# Patient Record
Sex: Male | Born: 1985
Health system: Southern US, Community
[De-identification: ages and names within clinical notes are randomized; demographics above are authoritative.]

## PROBLEM LIST (undated history)

## (undated) DIAGNOSIS — R112 Nausea with vomiting, unspecified: Secondary | ICD-10-CM

## (undated) DIAGNOSIS — K219 Gastro-esophageal reflux disease without esophagitis: Secondary | ICD-10-CM

## (undated) DIAGNOSIS — Z9889 Other specified postprocedural states: Secondary | ICD-10-CM

## (undated) HISTORY — PX: WISDOM TOOTH EXTRACTION: SHX21

---

## 2005-02-25 ENCOUNTER — Emergency Department (HOSPITAL_COMMUNITY): Admission: EM | Admit: 2005-02-25 | Discharge: 2005-02-25 | Payer: Self-pay | Admitting: Emergency Medicine

## 2006-02-20 ENCOUNTER — Emergency Department (HOSPITAL_COMMUNITY): Admission: EM | Admit: 2006-02-20 | Discharge: 2006-02-20 | Payer: Self-pay | Admitting: Emergency Medicine

## 2017-06-22 ENCOUNTER — Emergency Department (HOSPITAL_COMMUNITY)
Admission: EM | Admit: 2017-06-22 | Discharge: 2017-06-23 | Disposition: A | Discharge status: Home / Self Care | Payer: BLUE CROSS/BLUE SHIELD | Attending: Emergency Medicine | Admitting: Emergency Medicine

## 2017-06-22 ENCOUNTER — Emergency Department (HOSPITAL_COMMUNITY): Payer: BLUE CROSS/BLUE SHIELD

## 2017-06-22 ENCOUNTER — Other Ambulatory Visit: Payer: Self-pay

## 2017-06-22 DIAGNOSIS — Y92310 Basketball court as the place of occurrence of the external cause: Secondary | ICD-10-CM | POA: Insufficient documentation

## 2017-06-22 DIAGNOSIS — W19XXXA Unspecified fall, initial encounter: Secondary | ICD-10-CM | POA: Diagnosis not present

## 2017-06-22 DIAGNOSIS — Y9367 Activity, basketball: Secondary | ICD-10-CM | POA: Insufficient documentation

## 2017-06-22 DIAGNOSIS — Y998 Other external cause status: Secondary | ICD-10-CM | POA: Insufficient documentation

## 2017-06-22 DIAGNOSIS — S6992XA Unspecified injury of left wrist, hand and finger(s), initial encounter: Secondary | ICD-10-CM | POA: Diagnosis present

## 2017-06-22 DIAGNOSIS — Z8781 Personal history of (healed) traumatic fracture: Secondary | ICD-10-CM

## 2017-06-22 DIAGNOSIS — S52502A Unspecified fracture of the lower end of left radius, initial encounter for closed fracture: Secondary | ICD-10-CM | POA: Diagnosis not present

## 2017-06-22 MED ORDER — OXYCODONE-ACETAMINOPHEN 5-325 MG PO TABS: 1.0000 | ORAL_TABLET | ORAL | 0 refills | Status: DC | PRN

## 2017-06-22 MED ORDER — ONDANSETRON HCL 4 MG/2ML IJ SOLN
4.0000 mg | Freq: Once | INTRAMUSCULAR | Status: AC
  Administered 2017-06-22: 4 mg via INTRAVENOUS
  Filled 2017-06-22: qty 2

## 2017-06-22 MED ORDER — METOCLOPRAMIDE HCL 5 MG/ML IJ SOLN
10.0000 mg | Freq: Once | INTRAMUSCULAR | Status: AC
  Administered 2017-06-22: 10 mg via INTRAVENOUS
  Filled 2017-06-22: qty 2

## 2017-06-22 MED ORDER — ONDANSETRON 4 MG PO TBDP: 4.0000 mg | ORAL_TABLET | Freq: Three times a day (TID) | ORAL | 0 refills | Status: DC | PRN

## 2017-06-22 MED ORDER — FENTANYL CITRATE (PF) 100 MCG/2ML IJ SOLN
50.0000 ug | Freq: Once | INTRAMUSCULAR | Status: AC
  Administered 2017-06-22: 50 ug via INTRAMUSCULAR
  Filled 2017-06-22: qty 2

## 2017-06-22 MED ORDER — OXYCODONE-ACETAMINOPHEN 5-325 MG PO TABS: 1.0000 | ORAL_TABLET | Freq: Once | ORAL | Status: DC

## 2017-06-22 MED ORDER — KETAMINE HCL-SODIUM CHLORIDE 100-0.9 MG/10ML-% IV SOSY
1.0000 mg/kg | PREFILLED_SYRINGE | Freq: Once | INTRAVENOUS | Status: AC
  Administered 2017-06-22: 82 mg via INTRAVENOUS
  Filled 2017-06-22: qty 10

## 2017-06-22 NOTE — ED Notes (Signed)
Pt given food and drink.

## 2017-06-22 NOTE — ED Provider Notes (Signed)
Reduction of fracture Date/Time: 06/22/2017 11:45 PM Performed by: Maia PlanLong, Joshua G, MD Authorized by: Maia PlanLong, Joshua G, MD  Consent: Written consent obtained. Risks and benefits: risks, benefits and alternatives were discussed Consent given by: patient Patient understanding: patient states understanding of the procedure being performed Patient consent: the patient's understanding of the procedure matches consent given Procedure consent: procedure consent matches procedure scheduled Relevant documents: relevant documents present and verified Test results: test results available and properly labeled Imaging studies: imaging studies available Required items: required blood products, implants, devices, and special equipment available Patient identity confirmed: verbally with patient, arm band and hospital-assigned identification number Time out: Immediately prior to procedure a "time out" was called to verify the correct patient, procedure, equipment, support staff and site/side marked as required. Preparation: Patient was prepped and draped in the usual sterile fashion. Local anesthesia used: no  Anesthesia: Local anesthesia used: no  Sedation: Patient sedated: yes Sedation type: moderate (conscious) sedation Sedatives: ketamine Sedation start date/time: 06/22/2017 9:45 PM Sedation end date/time: 06/22/2017 10:15 PM Vitals: Vital signs were monitored during sedation.  Patient tolerance: Patient tolerated the procedure well with no immediate complications Comments: Traction applied to the left hand with flexion of the wrist until the deformity was reduced. Portable x-ray on hand with improved alignment.   .Sedation Date/Time: 06/22/2017 11:50 PM Performed by: Maia PlanLong, Joshua G, MD Authorized by: Maia PlanLong, Joshua G, MD   Consent:    Consent obtained:  Written   Consent given by:  Patient   Risks discussed:  Allergic reaction, dysrhythmia, inadequate sedation, nausea, vomiting, respiratory  compromise necessitating ventilatory assistance and intubation, prolonged sedation necessitating reversal and prolonged hypoxia resulting in organ damage   Alternatives discussed:  Analgesia without sedation Universal protocol:    Procedure explained and questions answered to patient or proxy's satisfaction: yes     Relevant documents present and verified: yes     Test results available and properly labeled: yes     Imaging studies available: yes     Required blood products, implants, devices, and special equipment available: yes     Immediately prior to procedure a time out was called: yes     Patient identity confirmation method:  Arm band and hospital-assigned identification number Indications:    Procedure performed:  Fracture reduction   Procedure necessitating sedation performed by:  Physician performing sedation   Intended level of sedation:  Moderate (conscious sedation) Pre-sedation assessment:    Time since last food or drink:  3.5   ASA classification: class 1 - normal, healthy patient     Mallampati score:  I - soft palate, uvula, fauces, pillars visible   Pre-sedation assessments completed and reviewed: airway patency, cardiovascular function, hydration status, mental status, nausea/vomiting, pain level, respiratory function and temperature   Immediate pre-procedure details:    Reassessment: Patient reassessed immediately prior to procedure     Reviewed: vital signs, relevant labs/tests and NPO status     Verified: bag valve mask available, emergency equipment available, intubation equipment available, IV patency confirmed, oxygen available, reversal medications available and suction available   Procedure details (see MAR for exact dosages):    Preoxygenation:  Nasal cannula   Sedation:  Ketamine   Intra-procedure monitoring:  Blood pressure monitoring, cardiac monitor, continuous capnometry, continuous pulse oximetry, frequent LOC assessments and frequent vital sign checks    Intra-procedure events: none     Total Provider sedation time (minutes):  30 Post-procedure details:    Post-sedation assessment completed:  06/22/2017 11:52 PM  Attendance: Constant attendance by certified staff until patient recovered     Recovery: Patient returned to pre-procedure baseline     Post-sedation assessments completed and reviewed: airway patency, cardiovascular function, hydration status, mental status, nausea/vomiting, pain level, respiratory function and temperature     Patient is stable for discharge or admission: yes     Patient tolerance:  Tolerated well, no immediate complications .Splint Application Date/Time: 06/22/2017 11:52 PM Performed by: Maia Plan, MD Authorized by: Maia Plan, MD   Consent:    Consent obtained:  Verbal   Consent given by:  Patient   Risks discussed:  Discoloration, numbness, pain and swelling Pre-procedure details:    Sensation:  Normal   Skin color:  Normal  Procedure details:    Laterality:  Left   Location:  Wrist   Wrist:  L wrist   Splint type:  Sugar tong   Supplies:  Ortho-Glass Post-procedure details:    Pain:  Improved   Sensation:  Normal   Skin color:  Normal   Patient tolerance of procedure:  Tolerated well, no immediate complications    I assumed primary care of this patient from the PA in triage.  The patient had a FOOSH injury while playing basketball.  He has a displaced distal radius fracture of the nondominant hand.  The hand is neurovascularly intact both pre-and postreduction.  The PA discussed with Dr. Orlan Leavens who advised reduction in the emergency department and referral to his office in the morning.   Patient tolerated the sedation and procedure well.  Sugar tong splint applied.  Patient tolerating PO at time of discharge. Patient to follow with the hand surgeon in the AM.   At this time, I do not feel there is any life-threatening condition present. I have reviewed and discussed all results (EKG,  imaging, lab, urine as appropriate), exam findings with patient. I have reviewed nursing notes and appropriate previous records.  I feel the patient is safe to be discharged home without further emergent workup. Discussed usual and customary return precautions. Patient and family (if present) verbalize understanding and are comfortable with this plan.  Patient will follow-up with their primary care provider. If they do not have a primary care provider, information for follow-up has been provided to them. All questions have been answered.  Alona Bene, MD    Maia Plan, MD 06/22/17 [phone]

## 2017-06-22 NOTE — Discharge Instructions (Signed)
You were seen in the ED today with a wrist fracture. We reduced the fracture. You will need to see the hand surgeon in the morning. Call the number listed to confirm the appointment. Do not eat anything prior to the appointment.   Keep the splint clean and dry. Take pain medication as needed. Do not drive a car if taking this mediation.

## 2017-06-22 NOTE — Progress Notes (Signed)
Orthopedic Tech Progress Note Patient Details:  Carlos Pittman 1985-12-13 [id number]  Ortho Devices Type of Ortho Device: Sugartong splint Ortho Device/Splint Location: lue Ortho Device/Splint Interventions: Ordered, Application, Adjustment   Post Interventions Patient Tolerated: Well Instructions Provided: Care of device, Adjustment of device Applied splint post reduction. Pt already had an arm sling.  [name], Zyad Boomer J 06/22/2017, 10:18 PM

## 2017-06-22 NOTE — ED Triage Notes (Signed)
Per pt he was playing basket ball and fell on his left wrist. Obvious dislocation. Pt is alert oriented. Says pain is 8 out of 10.

## 2017-06-22 NOTE — ED Provider Notes (Signed)
[facility] EMERGENCY DEPARTMENT Provider Note   CSN: [id number] Arrival date & time: 06/22/17  1815     History   Chief Complaint Chief Complaint  Patient presents with  . Wrist Injury    HPI Broden Holt is a 32 y.o. male with acute onset of left wrist pain status post mechanical fall that occurred while playing basketball prior to arrival.  Patient states he fell on his outstretched left hand and immediately felt pain with obvious deformity.  Denies numbness or tingling to his fingers.  Denies elbow pain or shoulder pain.  No head trauma or LOC.  No other injuries noted.  Is not on anticoagulation.  Last meal was at 11 AM today.  The history is provided by the patient.    No past medical history on file.  There are no active problems to display for this patient.   The histories are not reviewed yet. Please review them in the "History" navigator section and refresh this SmartLink.     Home Medications    Prior to Admission medications   Not on File    Family History No family history on file.  Social History Social History   Tobacco Use  . Smoking status: Not on file  Substance Use Topics  . Alcohol use: Not on file  . Drug use: Not on file     Allergies   Patient has no allergy information on record.   Review of Systems Review of Systems  Musculoskeletal: Positive for arthralgias and joint swelling.  Skin: Negative for wound.  All other systems reviewed and are negative.    Physical Exam Updated Vital Signs BP (!) 179/108   Pulse (!) 144   Temp 99.2 F (37.3 C) (Oral)   Resp (!) 28   Ht 5\' 11"  (1.803 m)   Wt 81.6 kg (180 lb)   SpO2 98%   BMI 25.10 kg/m   Physical Exam  Constitutional: He appears well-developed and well-nourished. No distress.  HENT:  Head: Normocephalic and atraumatic.  Eyes: Conjunctivae are normal.  Cardiovascular: Normal rate and intact distal pulses.  Pulmonary/Chest: Effort normal.    Abdominal: Soft.  Musculoskeletal:  Left wrist with obvious deformity, appears to be dinner fork deformity. No wounds. Able to move all digits with normal sensation. Good capillary refill.  Elbow and digits without tenderness.   Neurological: He is alert.  Skin: Skin is warm.  Psychiatric: He has a normal mood and affect. His behavior is normal.  Nursing note and vitals reviewed.    ED Treatments / Results  Labs (all labs ordered are listed, but only abnormal results are displayed) Labs Reviewed - No data to display  EKG  EKG Interpretation None       Radiology Dg Forearm Left  Result Date: 06/22/2017 CLINICAL DATA:  Fall EXAM: LEFT FOREARM - 2 VIEW COMPARISON:  None. FINDINGS: There is a comminuted fracture involving the distal radius. The fracture extends into the radiocarpal joint. There is complete dorsal displacement of the distal fragment with respect to the remainder of the radius. A displaced ulnar styloid fracture is associated. IMPRESSION: Comminuted markedly displaced fracture of the distal radius. Ulnar styloid fracture. Electronically Signed   By: Jolaine Click M.D.   On: 06/22/2017 20:27   Dg Wrist Complete Left  Result Date: 06/22/2017 CLINICAL DATA:  Fall during basketball game with pain and deformity. EXAM: LEFT WRIST - COMPLETE 3+ VIEW COMPARISON:  None. FINDINGS: Transverse fracture of the distal radial metaphysis  with complete dorsal displacement of the distal fragment and foreshortening. Transverse fracture of the ulnar styloid, also dorsally displaced. Carpal bones move with the distal radial articular surface. IMPRESSION: Distal radial metaphyseal fracture and ulnar styloid fracture completely displaced dorsally with some foreshortening. Carpal bones move with the distal radial articular surface. Electronically Signed   By: Paulina FusiMark  Shogry M.D.   On: 06/22/2017 20:28    Procedures Procedures (including critical care time)  Medications Ordered in  ED Medications  fentaNYL (SUBLIMAZE) injection 50 mcg (50 mcg Intramuscular Given 06/22/17 1958)  ketamine 100 mg in normal saline 10 mL (10mg /mL) syringe (82 mg Intravenous Given 06/22/17 2152)  ondansetron (ZOFRAN) injection 4 mg (4 mg Intravenous Given 06/22/17 2149)     Initial Impression / Assessment and Plan / ED Course  I have reviewed the triage vital signs and the nursing notes.  Pertinent labs & imaging results that were available during my care of the patient were reviewed by me and considered in my medical decision making (see chart for details).     Patient presenting with left wrist deformity status post mechanical fall on outstretched hand that occurred prior to arrival.  Obvious deformity noted.  Fingers are well perfused with normal sensation.  X-ray showing distal radius fracture completely displaced dorsally, and ulnar styloid fracture.  Dr. Karl Itortmanm consulted, hand specialist, who recommends ED reduction and follow-up in his office first thing in the morning.  Patient moved to acute care area of department where care assumed by Dr. Jacqulyn BathLong, who will perform reduction and will dispo patient. See his note for further documentation.  Patient discussed with and seen by Dr. Jacqulyn BathLong.  Final Clinical Impressions(s) / ED Diagnoses   Final diagnoses:  Displaced fracture of distal end of left radius    ED Discharge Orders    None       Zandrea Kenealy, [place] N, PA-C 06/22/17 2200    Maia PlanLong, Joshua G, MD 06/23/17 [phone]

## 2017-06-22 NOTE — Sedation Documentation (Signed)
Ortho at bedside. Pt stable at this time.

## 2017-06-22 NOTE — ED Notes (Signed)
Pt is alert and oriented x4. Vital stable at this time.

## 2017-06-22 NOTE — ED Notes (Signed)
Patient transported to X-ray 

## 2017-06-23 NOTE — H&P (Signed)
Carlos Pittman is an 32 y.o. male.   Chief Complaint: LEFT DISTAL RADIUS DISPLACED FRACTURE  HPI: the patient is a right-hand dominant 32 year old male who sustained an injury to the left wrist on June 22, 2017 while playing basketball.   He was seen in the emergency department where the wrist was reduced and he was placed in a splint. He followed up in our office one day later for further evaluation.  We discussed the reason and rationale for surgical intervention and the use of a plate and screws to realign the bone and allow for proper healing. The surgical procedure was discussed, including the risks versus benefits, and the postoperative recovery. The patient is to remain in the splint until the time of surgery. The patient is here today for surgery.  No past medical history on file.  No family history on file. Social History:  has no tobacco, alcohol, and drug history on file.  Allergies: No Known Allergies  No medications prior to admission.    No results found for this or any previous visit (from the past 48 hour(s)). Dg Forearm Left  Result Date: 06/22/2017 CLINICAL DATA:  Fall EXAM: LEFT FOREARM - 2 VIEW COMPARISON:  None. FINDINGS: There is a comminuted fracture involving the distal radius. The fracture extends into the radiocarpal joint. There is complete dorsal displacement of the distal fragment with respect to the remainder of the radius. A displaced ulnar styloid fracture is associated. IMPRESSION: Comminuted markedly displaced fracture of the distal radius. Ulnar styloid fracture. Electronically Signed   By: Jolaine Click M.D.   On: 06/22/2017 20:27   Dg Wrist 2 Views Left  Result Date: 06/22/2017 CLINICAL DATA:  Post reduction EXAM: LEFT WRIST - 2 VIEW COMPARISON:  06/22/2017 radiographs FINDINGS: Mildly displaced ulnar styloid process fracture, decreased compared to previous. Fracture through the distal radius. Residual 1/2 shaft diameter of dorsal displacement of  distal fracture fragment with respect to shaft of radius. IMPRESSION: Displaced distal radius and ulna fractures with decreased displacement compared to prior exam; there remains about 1/2 shaft diameter of residual dorsal displacement of the distal radius fracture fragment with respect to the distal shaft of the radius. Electronically Signed   By: Jasmine Pang M.D.   On: 06/22/2017 22:24   Dg Wrist Complete Left  Result Date: 06/22/2017 CLINICAL DATA:  Fall during basketball game with pain and deformity. EXAM: LEFT WRIST - COMPLETE 3+ VIEW COMPARISON:  None. FINDINGS: Transverse fracture of the distal radial metaphysis with complete dorsal displacement of the distal fragment and foreshortening. Transverse fracture of the ulnar styloid, also dorsally displaced. Carpal bones move with the distal radial articular surface. IMPRESSION: Distal radial metaphyseal fracture and ulnar styloid fracture completely displaced dorsally with some foreshortening. Carpal bones move with the distal radial articular surface. Electronically Signed   By: Paulina Fusi M.D.   On: 06/22/2017 20:28    ROS NO RECENT ILLNESSES OR HOSPITALIZATIONS  There were no vitals taken for this visit. Physical Exam  General Appearance:  Alert, cooperative, no distress, appears stated age  Head:  Normocephalic, without obvious abnormality, atraumatic  Eyes:  Pupils equal, conjunctiva/corneas clear,         Throat: Lips, mucosa, and tongue normal; teeth and gums normal  Neck: No visible masses     Lungs:   respirations unlabored  Chest Wall:  No tenderness or deformity  Heart:  Regular rate and rhythm,  Abdomen:   Soft, non-tender,  Extremities:   Pulses: 2+ and symmetric  Skin: Skin color, texture, turgor normal, no rashes or lesions     Neurologic: Normal    Assessment LEFT DISTAL RADIUS DISPLACED FRACTURE  Plan LEFT DISTAL RADIUS OPEN REDUCTION AND INTERNAL FIXATION WITH REPAIR AS INDICATED  R/B/A DISCUSSED  WITH PT IN OFFICE.  PT VOICED UNDERSTANDING OF PLAN CONSENT SIGNED DAY OF SURGERY PT SEEN AND EXAMINED PRIOR TO OPERATIVE PROCEDURE/DAY OF SURGERY SITE MARKED. QUESTIONS ANSWERED WILL GO HOME FOLLOWING SURGERY  WE ARE PLANNING SURGERY FOR YOUR UPPER EXTREMITY. THE RISKS AND BENEFITS OF SURGERY INCLUDE BUT NOT LIMITED TO BLEEDING INFECTION, DAMAGE TO NEARBY NERVES ARTERIES TENDONS, FAILURE OF SURGERY TO ACCOMPLISH ITS INTENDED GOALS, PERSISTENT SYMPTOMS AND NEED FOR FURTHER SURGICAL INTERVENTION. WITH THIS IN MIND WE WILL PROCEED. I HAVE DISCUSSED WITH THE PATIENT THE PRE AND POSTOPERATIVE REGIMEN AND THE DOS AND DON'TS. PT VOICED UNDERSTANDING AND INFORMED CONSENT SIGNED.     Karma GreaserSamantha Bonham Barton 06/23/2017, 5:18 PM

## 2017-06-24 ENCOUNTER — Other Ambulatory Visit: Payer: Self-pay

## 2017-06-24 ENCOUNTER — Encounter (HOSPITAL_COMMUNITY): Payer: Self-pay | Admitting: *Deleted

## 2017-06-24 NOTE — Progress Notes (Signed)
Spoke with pt for pre-op call. Pt denies cardiac history or diabetes.  

## 2017-06-24 NOTE — Anesthesia Preprocedure Evaluation (Addendum)
Anesthesia Evaluation  Patient identified by MRN, date of birth, ID band Patient awake    Reviewed: Allergy & Precautions, NPO status , Patient's Chart, lab work & pertinent test results  History of Anesthesia Complications (+) PONV and history of anesthetic complications  Airway Mallampati: II  TM Distance: >3 FB Neck ROM: Full    Dental no notable dental hx.    Pulmonary Current Smoker,    Pulmonary exam normal breath sounds clear to auscultation       Cardiovascular negative cardio ROS Normal cardiovascular exam Rhythm:Regular Rate:Normal     Neuro/Psych negative neurological ROS  negative psych ROS   GI/Hepatic Neg liver ROS, GERD (diet-related)  Controlled,  Endo/Other  negative endocrine ROS  Renal/GU negative Renal ROS     Musculoskeletal negative musculoskeletal ROS (+)   Abdominal   Peds  Hematology negative hematology ROS (+)   Anesthesia Other Findings Left distal radius fracture displaced  Reproductive/Obstetrics                            Anesthesia Physical Anesthesia Plan  ASA: II  Anesthesia Plan: General and Regional   Post-op Pain Management: GA combined w/ Regional for post-op pain   Induction: Intravenous  PONV Risk Score and Plan: 2 and Ondansetron, Midazolam and Treatment may vary due to age or medical condition  Airway Management Planned: LMA  Additional Equipment:   Intra-op Plan:   Post-operative Plan: Extubation in OR  Informed Consent: I have reviewed the patients History and Physical, chart, labs and discussed the procedure including the risks, benefits and alternatives for the proposed anesthesia with the patient or authorized representative who has indicated his/her understanding and acceptance.   Dental advisory given  Plan Discussed with: CRNA  Anesthesia Plan Comments:         Anesthesia Quick Evaluation

## 2017-06-25 ENCOUNTER — Ambulatory Visit (HOSPITAL_COMMUNITY)
Admission: RE | Admit: 2017-06-25 | Discharge: 2017-06-25 | Disposition: A | Discharge status: Home / Self Care | Payer: BLUE CROSS/BLUE SHIELD | Source: Ambulatory Visit | Attending: Orthopedic Surgery | Admitting: Orthopedic Surgery

## 2017-06-25 ENCOUNTER — Ambulatory Visit (HOSPITAL_COMMUNITY): Payer: BLUE CROSS/BLUE SHIELD | Admitting: Anesthesiology

## 2017-06-25 ENCOUNTER — Encounter (HOSPITAL_COMMUNITY)
Admission: RE | Disposition: A | Discharge status: Home / Self Care | Payer: Self-pay | Source: Ambulatory Visit | Attending: Orthopedic Surgery

## 2017-06-25 ENCOUNTER — Encounter (HOSPITAL_COMMUNITY): Payer: Self-pay | Admitting: *Deleted

## 2017-06-25 DIAGNOSIS — F17210 Nicotine dependence, cigarettes, uncomplicated: Secondary | ICD-10-CM | POA: Insufficient documentation

## 2017-06-25 DIAGNOSIS — S52502A Unspecified fracture of the lower end of left radius, initial encounter for closed fracture: Secondary | ICD-10-CM

## 2017-06-25 DIAGNOSIS — S52572A Other intraarticular fracture of lower end of left radius, initial encounter for closed fracture: Secondary | ICD-10-CM | POA: Insufficient documentation

## 2017-06-25 DIAGNOSIS — X58XXXA Exposure to other specified factors, initial encounter: Secondary | ICD-10-CM | POA: Insufficient documentation

## 2017-06-25 HISTORY — PX: OPEN REDUCTION INTERNAL FIXATION (ORIF) DISTAL RADIAL FRACTURE: SHX5989

## 2017-06-25 HISTORY — DX: Gastro-esophageal reflux disease without esophagitis: K21.9

## 2017-06-25 HISTORY — DX: Other specified postprocedural states: Z98.890

## 2017-06-25 HISTORY — DX: Nausea with vomiting, unspecified: R11.2

## 2017-06-25 SURGERY — OPEN REDUCTION INTERNAL FIXATION (ORIF) DISTAL RADIUS FRACTURE
Anesthesia: Regional | Site: Wrist | Laterality: Left

## 2017-06-25 MED ORDER — FENTANYL CITRATE (PF) 250 MCG/5ML IJ SOLN
INTRAMUSCULAR | Status: DC | PRN
  Administered 2017-06-25: 100 ug via INTRAVENOUS

## 2017-06-25 MED ORDER — CHLORHEXIDINE GLUCONATE 4 % EX LIQD: 60.0000 mL | Freq: Once | CUTANEOUS | Status: DC

## 2017-06-25 MED ORDER — DEXAMETHASONE SODIUM PHOSPHATE 10 MG/ML IJ SOLN
INTRAMUSCULAR | Status: DC | PRN
  Administered 2017-06-25: 10 mg via INTRAVENOUS

## 2017-06-25 MED ORDER — LIDOCAINE HCL (CARDIAC) 20 MG/ML IV SOLN
INTRAVENOUS | Status: DC | PRN
  Administered 2017-06-25: 60 mg via INTRATRACHEAL

## 2017-06-25 MED ORDER — FENTANYL CITRATE (PF) 100 MCG/2ML IJ SOLN
25.0000 ug | INTRAMUSCULAR | Status: DC | PRN
Start: 2017-06-25 — End: 2017-06-25

## 2017-06-25 MED ORDER — FENTANYL CITRATE (PF) 250 MCG/5ML IJ SOLN
INTRAMUSCULAR | Status: AC
  Filled 2017-06-25: qty 5

## 2017-06-25 MED ORDER — MIDAZOLAM HCL 2 MG/2ML IJ SOLN
INTRAMUSCULAR | Status: AC
  Filled 2017-06-25: qty 2

## 2017-06-25 MED ORDER — ONDANSETRON HCL 4 MG/2ML IJ SOLN: 4.0000 mg | Freq: Once | INTRAMUSCULAR | Status: DC | PRN

## 2017-06-25 MED ORDER — MIDAZOLAM HCL 2 MG/2ML IJ SOLN
INTRAMUSCULAR | Status: DC | PRN
  Administered 2017-06-25 (×2): 1 mg via INTRAVENOUS

## 2017-06-25 MED ORDER — ONDANSETRON HCL 4 MG/2ML IJ SOLN
INTRAMUSCULAR | Status: DC | PRN
  Administered 2017-06-25: 4 mg via INTRAVENOUS

## 2017-06-25 MED ORDER — CEFAZOLIN SODIUM-DEXTROSE 2-4 GM/100ML-% IV SOLN
2.0000 g | INTRAVENOUS | Status: AC
  Administered 2017-06-25: 2 g via INTRAVENOUS
  Filled 2017-06-25: qty 100

## 2017-06-25 MED ORDER — ROPIVACAINE HCL 7.5 MG/ML IJ SOLN
INTRAMUSCULAR | Status: DC | PRN
  Administered 2017-06-25: 20 mL via PERINEURAL

## 2017-06-25 MED ORDER — 0.9 % SODIUM CHLORIDE (POUR BTL) OPTIME
TOPICAL | Status: DC | PRN
  Administered 2017-06-25: 1000 mL

## 2017-06-25 MED ORDER — LACTATED RINGERS IV SOLN
INTRAVENOUS | Status: DC | PRN
Start: 2017-06-25 — End: 2017-06-25
  Administered 2017-06-25: 08:00:00 via INTRAVENOUS

## 2017-06-25 MED ORDER — PROPOFOL 10 MG/ML IV BOLUS
INTRAVENOUS | Status: DC | PRN
  Administered 2017-06-25: 150 mg via INTRAVENOUS

## 2017-06-25 SURGICAL SUPPLY — 61 items
BANDAGE ACE 3X5.8 VEL STRL LF (GAUZE/BANDAGES/DRESSINGS) ×3 IMPLANT
BANDAGE ACE 4X5 VEL STRL LF (GAUZE/BANDAGES/DRESSINGS) ×6 IMPLANT
BIT DRILL 2.2 SS TIBIAL (BIT) ×3 IMPLANT
BLADE CLIPPER SURG (BLADE) IMPLANT
BNDG ESMARK 4X9 LF (GAUZE/BANDAGES/DRESSINGS) ×3 IMPLANT
BNDG GAUZE ELAST 4 BULKY (GAUZE/BANDAGES/DRESSINGS) ×3 IMPLANT
CANISTER SUCT 3000ML PPV (MISCELLANEOUS) ×3 IMPLANT
CORDS BIPOLAR (ELECTRODE) ×3 IMPLANT
COVER SURGICAL LIGHT HANDLE (MISCELLANEOUS) ×3 IMPLANT
CUFF TOURNIQUET SINGLE 18IN (TOURNIQUET CUFF) ×3 IMPLANT
CUFF TOURNIQUET SINGLE 24IN (TOURNIQUET CUFF) IMPLANT
DECANTER SPIKE VIAL GLASS SM (MISCELLANEOUS) ×3 IMPLANT
DRAPE OEC MINIVIEW 54X84 (DRAPES) ×3 IMPLANT
DRAPE SURG 17X11 SM STRL (DRAPES) ×3 IMPLANT
DRSG ADAPTIC 3X8 NADH LF (GAUZE/BANDAGES/DRESSINGS) ×3 IMPLANT
GAUZE SPONGE 4X4 12PLY STRL (GAUZE/BANDAGES/DRESSINGS) ×3 IMPLANT
GAUZE SPONGE 4X4 16PLY XRAY LF (GAUZE/BANDAGES/DRESSINGS) ×3 IMPLANT
GAUZE XEROFORM 5X9 LF (GAUZE/BANDAGES/DRESSINGS) ×3 IMPLANT
GLOVE BIOGEL PI IND STRL 8.5 (GLOVE) ×1 IMPLANT
GLOVE BIOGEL PI INDICATOR 8.5 (GLOVE) ×2
GLOVE SURG ORTHO 8.0 STRL STRW (GLOVE) ×3 IMPLANT
GOWN STRL REUS W/ TWL LRG LVL3 (GOWN DISPOSABLE) ×1 IMPLANT
GOWN STRL REUS W/ TWL XL LVL3 (GOWN DISPOSABLE) ×1 IMPLANT
GOWN STRL REUS W/TWL LRG LVL3 (GOWN DISPOSABLE) ×2
GOWN STRL REUS W/TWL XL LVL3 (GOWN DISPOSABLE) ×2
K-WIRE 1.6 (WIRE) ×2
K-WIRE FX5X1.6XNS BN SS (WIRE) ×1
KIT BASIN OR (CUSTOM PROCEDURE TRAY) ×3 IMPLANT
KIT ROOM TURNOVER OR (KITS) ×3 IMPLANT
KWIRE FX5X1.6XNS BN SS (WIRE) ×1 IMPLANT
NEEDLE HYPO 25X1 1.5 SAFETY (NEEDLE) ×3 IMPLANT
NS IRRIG 1000ML POUR BTL (IV SOLUTION) ×3 IMPLANT
PACK ORTHO EXTREMITY (CUSTOM PROCEDURE TRAY) ×3 IMPLANT
PAD ARMBOARD 7.5X6 YLW CONV (MISCELLANEOUS) ×6 IMPLANT
PAD CAST 4YDX4 CTTN HI CHSV (CAST SUPPLIES) ×1 IMPLANT
PADDING CAST COTTON 4X4 STRL (CAST SUPPLIES) ×2
PEG LOCKING SMOOTH 2.2X18 (Peg) ×6 IMPLANT
PEG LOCKING SMOOTH 2.2X20 (Screw) ×6 IMPLANT
PEG LOCKING SMOOTH 2.2X22 (Screw) ×3 IMPLANT
PEG LOCKING SMOOTH 2.2X24 (Peg) ×6 IMPLANT
PLATE STANDARD DVR LEFT (Plate) ×3 IMPLANT
PLATE STD DVR LT 24X51 (Plate) ×1 IMPLANT
SCREW LOCK 16X2.7X 3 LD TPR (Screw) ×2 IMPLANT
SCREW LOCK 18X2.7X 3 LD TPR (Screw) ×2 IMPLANT
SCREW LOCKING 2.7X15MM (Screw) ×6 IMPLANT
SCREW LOCKING 2.7X16 (Screw) ×4 IMPLANT
SCREW LOCKING 2.7X18 (Screw) ×4 IMPLANT
SOAP 2 % CHG 4 OZ (WOUND CARE) ×3 IMPLANT
SPLINT FIBERGLASS 3X35 (CAST SUPPLIES) ×6 IMPLANT
SPONGE LAP 4X18 X RAY DECT (DISPOSABLE) ×3 IMPLANT
SUT PROLENE 4 0 PS 2 18 (SUTURE) ×6 IMPLANT
SUT VIC AB 2-0 CT1 27 (SUTURE) ×2
SUT VIC AB 2-0 CT1 TAPERPNT 27 (SUTURE) ×1 IMPLANT
SUT VICRYL 4-0 PS2 18IN ABS (SUTURE) ×3 IMPLANT
SYR CONTROL 10ML LL (SYRINGE) IMPLANT
TOWEL OR 17X24 6PK STRL BLUE (TOWEL DISPOSABLE) ×3 IMPLANT
TOWEL OR 17X26 10 PK STRL BLUE (TOWEL DISPOSABLE) ×3 IMPLANT
TUBE CONNECTING 12'X1/4 (SUCTIONS) ×1
TUBE CONNECTING 12X1/4 (SUCTIONS) ×2 IMPLANT
WATER STERILE IRR 1000ML POUR (IV SOLUTION) ×3 IMPLANT
YANKAUER SUCT BULB TIP NO VENT (SUCTIONS) IMPLANT

## 2017-06-25 NOTE — Anesthesia Procedure Notes (Signed)
Procedure Name: LMA Insertion Date/Time: 06/25/2017 8:03 AM Performed by: Burt Ekurner, Saba Gomm Ashley, CRNA Pre-anesthesia Checklist: Patient identified, Emergency Drugs available, Suction available and Patient being monitored Patient Re-evaluated:Patient Re-evaluated prior to induction Oxygen Delivery Method: Circle system utilized Preoxygenation: Pre-oxygenation with 100% oxygen Induction Type: IV induction Ventilation: Mask ventilation without difficulty LMA: LMA inserted LMA Size: 5.0 Number of attempts: 1 Placement Confirmation: positive ETCO2 and breath sounds checked- equal and bilateral Tube secured with: Tape Dental Injury: Teeth and Oropharynx as per pre-operative assessment

## 2017-06-25 NOTE — Anesthesia Procedure Notes (Signed)
Anesthesia Regional Block: Supraclavicular block   Pre-Anesthetic Checklist: ,, timeout performed, Correct Patient, Correct Site, Correct Laterality, Correct Procedure,, site marked, risks and benefits discussed, Surgical consent,  Pre-op evaluation,  At surgeon's request and post-op pain management  Laterality: Left  Prep: chloraprep       Needles:  Injection technique: Single-shot  Needle Type: Echogenic Stimulator Needle     Needle Length: 9cm  Needle Gauge: 21     Additional Needles:   Procedures:,,,, ultrasound used (permanent image in chart),,,,  Narrative:  Start time: 06/25/2017 7:45 AM End time: 06/25/2017 7:55 AM Injection made incrementally with aspirations every 5 mL.  Performed by: Personally  Anesthesiologist: Leonides GrillsEllender, Eldred Sooy P, MD  Additional Notes: Functioning IV was confirmed and monitors were applied.  A 90mm 21ga Arrow echogenic stimulator needle was used. Sterile prep, hand hygiene and sterile gloves were used.  Negative aspiration and negative test dose prior to incremental administration of local anesthetic. The patient tolerated the procedure well.

## 2017-06-25 NOTE — Discharge Instructions (Signed)
KEEP BANDAGE CLEAN AND DRY CALL OFFICE FOR F/U APPT [phone] in 20 DAYS DR Melvyn NovasTMANN [phone] RX SENT TO [facility] [place] [address] KEEP HAND ELEVATED ABOVE HEART OK TO APPLY ICE TO OPERATIVE AREA CONTACT OFFICE IF ANY WORSENING PAIN OR CONCERNS.

## 2017-06-25 NOTE — Transfer of Care (Signed)
Immediate Anesthesia Transfer of Care Note  Patient: Carlos Pittman  Procedure(s) Performed: OPEN REDUCTION INTERNAL FIXATION (ORIF) DISTAL RADIAL FRACTURE (Left Wrist)  Patient Location: PACU  Anesthesia Type:General  Level of Consciousness: awake, alert , oriented and patient cooperative  Airway & Oxygen Therapy: Patient Spontanous Breathing  Post-op Assessment: Report given to RN, Post -op Vital signs reviewed and stable and Patient moving all extremities X 4  Post vital signs: Reviewed and stable  Last Vitals:  Vitals:   06/25/17 0752 06/25/17 0753  BP:    Pulse: 67 67  Resp: 12 10  Temp:    SpO2: 99% 99%    Last Pain:  Vitals:   06/25/17 0724  TempSrc:   PainSc: 4       Patients Stated Pain Goal: 5 (06/25/17 0724)  Complications: No apparent anesthesia complications

## 2017-06-25 NOTE — Anesthesia Postprocedure Evaluation (Signed)
Anesthesia Post Note  Patient: Stefani Damaim Keatley  Procedure(s) Performed: OPEN REDUCTION INTERNAL FIXATION (ORIF) DISTAL RADIAL FRACTURE (Left Wrist)     Patient location during evaluation: PACU Anesthesia Type: Regional and General Level of consciousness: awake and alert Pain management: pain level controlled Vital Signs Assessment: post-procedure vital signs reviewed and stable Respiratory status: spontaneous breathing, nonlabored ventilation, respiratory function stable and patient connected to nasal cannula oxygen Cardiovascular status: blood pressure returned to baseline and stable Postop Assessment: no apparent nausea or vomiting Anesthetic complications: no    Last Vitals:  Vitals:   06/25/17 0911 06/25/17 0930  BP: (!) 140/91   Pulse: 81   Resp: 15   Temp:  36.4 C  SpO2: 100%     Last Pain:  Vitals:   06/25/17 0930  TempSrc:   PainSc: 0-No pain                 Ryan P Ellender

## 2017-06-25 NOTE — Op Note (Signed)
PREOPERATIVE DIAGNOSIS: Left wrist comminuted intra-articular distal radius fracture 3 more fragments  POSTOPERATIVE DIAGNOSIS: Same  ATTENDING SURGEON: Dr. Gilman Schmidt who was scrubbed and present for the entire procedure  ASSISTANT SURGEON: None  ANESTHESIA: Regional block with general anesthesia via LMA  OPERATIVE PROCEDURE: #1: Open treatment of left wrist comminuted intra-articular distal radius fracture 3 more fragments with internal fixation #2: Left wrist brachia radialis tendon tenotomy and release #3: Radiographs 3 views left wrist  IMPLANTS: Standard DVR Biomet cross lock  RADIOGRAPHIC INTERPRETATION: AP lateral oblique views of the wrist to show the volar plate fixation place with good alignment of the distal radioulnar joint and radiocarpal joint with a volar plate fixation in good position  SURGICAL INDICATIONS: Patient is a right-hand-dominant gentleman who fell on an outstretched wrist playing basketball. Patient seen and evaluated in the office and given the degree of comminution displaced and the fracture is recommended that he undergo the above procedure. Risks benefits and alternatives were discussed in detail with the patient in a signed informed consent was obtained. Risks include but not limited to bleeding infection damage to nearby nerves arteries or tendons loss of motion of the wrists and digits incomplete relief of symptoms and need for further surgical intervention.  SURGICAL TECHNIQUE: The patient is probably identified in the preoperative holding area and a mark with a permanent marker made on the left wrist indicate correct operative site. Patient is then brought back to operating room placed supine on anesthesia and table general anesthesia was administered. Patient tolerated this well. A well-padded tourniquet was then placed on the left brachium and sealed with the appropriate drape. Left upper extremity is then prepped and draped in normal sterile fashion.  Timeout was called the correct site was identified and the procedure was then begun. Attention was then turned the left wrist. A longitudinal incision made directly over the FCR sheath. Dissection carried down through the skin and subcutaneous tissue. The FCR sheath was then opened proximally distally. Going through the floor the FCR sheath the FPL was then swept out of the way and the pronator quadratus what was left remaining of the pronator quadratus was then carefully elevated off the distal radius. The patient did have a high degree of comminution this was an intra-articular distal radius fracture 3 more fragments. In order to reduce the radial column careful attention was made to the releasing the brachia radialis. Tendon tenotomy and release the brachia radialis was then carried off off the radial styloid.. After release the this allowed for reduction of the radial column. Open reduction was then performed. A standard DVR cross lock plate was chosen. It was held distally with a K wire and its position confirmed using mini C-arm. After plate position confirmation screw fixation was then begun with the oblong screw hole proximally. Distal peg and screw fixation was then carried out from an ulnar to radial to traction distally. The screws were then confirmed using the mini C-arm. Following this fixation was then achieved proximally with accommodation of locking and nonlocking screws within the shaft. The wound was thoroughly irrigated. Final radiographs were then obtained. The pronator quadratus was then closed with 2-0 Vicryl suture. Subcutaneous tissues closed with 4-0 Vicryl. The skin was then closed with 4-0 Prolene sutures. Adaptic dressing a sterile compressive bandage then applied. The patient is then placed in well-padded sugar tong splint keeping him and supination. Patient is explained taken recovery room in good condition.  Intraoperative stability assessment: Patient did have a significant  soft  tissue along the ulnar column of the wrist. After fixation of the distal radius stability was assessed of the distal radioulnar joint. The patient did have some laxity with neutral and pronation. Had good stability of the distal radioulnar joint and supination. Given the soft tissue injury on the ulnar aspect of the wrists as well as the associated ulnar styloid this felt to splint the forearm in supination.  POSTOPERATIVE PLAN: Patient is going to be discharged to home. Seen back in the office and proxy 2-3 weeks' x-rays out of the splint. Obtained short arm cast following this begin a therapy regimen around the 5 week mark. Radiographs at each visit. Place the therapy order at the first postoperative visit.

## 2017-06-27 ENCOUNTER — Encounter (HOSPITAL_COMMUNITY): Payer: Self-pay | Admitting: Orthopedic Surgery

## 2017-08-22 DIAGNOSIS — S52561D Barton's fracture of right radius, subsequent encounter for closed fracture with routine healing: Secondary | ICD-10-CM | POA: Diagnosis not present

## 2017-08-31 DIAGNOSIS — M25632 Stiffness of left wrist, not elsewhere classified: Secondary | ICD-10-CM | POA: Diagnosis not present

## 2017-08-31 DIAGNOSIS — M20011 Mallet finger of right finger(s): Secondary | ICD-10-CM | POA: Diagnosis not present

## 2017-09-19 DIAGNOSIS — M25632 Stiffness of left wrist, not elsewhere classified: Secondary | ICD-10-CM | POA: Diagnosis not present

## 2017-09-19 DIAGNOSIS — S52561D Barton's fracture of right radius, subsequent encounter for closed fracture with routine healing: Secondary | ICD-10-CM | POA: Diagnosis not present

## 2017-09-19 DIAGNOSIS — M20011 Mallet finger of right finger(s): Secondary | ICD-10-CM | POA: Diagnosis not present

## 2017-10-17 DIAGNOSIS — M20011 Mallet finger of right finger(s): Secondary | ICD-10-CM | POA: Diagnosis not present

## 2017-10-17 DIAGNOSIS — Z4789 Encounter for other orthopedic aftercare: Secondary | ICD-10-CM | POA: Diagnosis not present

## 2017-10-17 DIAGNOSIS — S52561D Barton's fracture of right radius, subsequent encounter for closed fracture with routine healing: Secondary | ICD-10-CM | POA: Diagnosis not present

## 2018-12-14 ENCOUNTER — Emergency Department (HOSPITAL_COMMUNITY)
Admission: EM | Admit: 2018-12-14 | Discharge: 2018-12-15 | Disposition: A | Discharge status: Home / Self Care | Payer: BC Managed Care – PPO | Attending: Emergency Medicine | Admitting: Emergency Medicine

## 2018-12-14 DIAGNOSIS — F419 Anxiety disorder, unspecified: Secondary | ICD-10-CM | POA: Insufficient documentation

## 2018-12-14 DIAGNOSIS — F17210 Nicotine dependence, cigarettes, uncomplicated: Secondary | ICD-10-CM | POA: Diagnosis not present

## 2018-12-14 DIAGNOSIS — R002 Palpitations: Secondary | ICD-10-CM | POA: Diagnosis not present

## 2018-12-14 DIAGNOSIS — R079 Chest pain, unspecified: Secondary | ICD-10-CM | POA: Diagnosis not present

## 2018-12-15 ENCOUNTER — Emergency Department (HOSPITAL_COMMUNITY): Payer: BC Managed Care – PPO

## 2018-12-15 ENCOUNTER — Encounter (HOSPITAL_COMMUNITY): Payer: Self-pay

## 2018-12-15 ENCOUNTER — Other Ambulatory Visit: Payer: Self-pay

## 2018-12-15 DIAGNOSIS — R079 Chest pain, unspecified: Secondary | ICD-10-CM | POA: Diagnosis not present

## 2018-12-15 LAB — BASIC METABOLIC PANEL
Anion gap: 9 (ref 5–15)
BUN: 14 mg/dL (ref 6–20)
CO2: 21 mmol/L — ABNORMAL LOW (ref 22–32)
Calcium: 9.2 mg/dL (ref 8.9–10.3)
Chloride: 108 mmol/L (ref 98–111)
Creatinine, Ser: 0.92 mg/dL (ref 0.61–1.24)
GFR calc Af Amer: >60 mL/min (ref >60)
GFR calc non Af Amer: >60 mL/min (ref >60)
Glucose, Bld: 101 mg/dL — ABNORMAL HIGH (ref 70–99)
Potassium: 3.7 mmol/L (ref 3.5–5.1)
Sodium: 138 mmol/L (ref 135–145)

## 2018-12-15 LAB — CBC
HCT: 41.7 % (ref 39.0–52.0)
Hemoglobin: 13.4 g/dL (ref 13.0–17.0)
MCH: 23.2 pg — ABNORMAL LOW (ref 26.0–34.0)
MCHC: 32.1 g/dL (ref 30.0–36.0)
MCV: 72.3 fL — ABNORMAL LOW (ref 80.0–100.0)
Platelets: 132 10*3/uL — ABNORMAL LOW (ref 150–400)
RBC: 5.77 MIL/uL (ref 4.22–5.81)
RDW: 14.7 % (ref 11.5–15.5)
WBC: 7.5 10*3/uL (ref 4.0–10.5)
nRBC: 0 % (ref 0.0–0.2)

## 2018-12-15 LAB — D-DIMER, QUANTITATIVE: D-Dimer, Quant: <0.27 ug/mL-FEU (ref 0.00–0.50)

## 2018-12-15 LAB — TROPONIN I (HIGH SENSITIVITY)
Troponin I (High Sensitivity): 2 ng/L (ref <18)
Troponin I (High Sensitivity): <2 ng/L (ref <18)

## 2018-12-15 NOTE — Discharge Instructions (Signed)
Your testing is negative for any evidence of heart attack or blood clot in the lung.  Follow-up with your doctor regarding your palpitations.  Return to the ED if you have exertional chest pain, shortness of breath, nausea, vomiting, sweating or any concerns.

## 2018-12-15 NOTE — ED Provider Notes (Signed)
[facility] EM[facility]ENCY [facility] Provider Note   CSN: [id number] Arrival date & time: 12/14/18  2353     History   Chief Complaint Chief Complaint  Patient presents with  . Palpitations    HPI Carlos Pittman is a 33 y.o. male.     Patient here with palpitations that woke him from sleep and resolved after several minutes.  He is feeling better at this time.  He states he was having palpitations on and off for the past several weeks that come and go lasting for several minutes at a time.  This was the first time they woke him from sleep so he became concerned.  He denies any chest pain, shortness of breath, nausea, vomiting or dizziness.  He admits that he is anxious because he recently found out his coworker tested positive for corona virus.  He does not have any symptoms himself.  He denies any cough or fever.  He denies any body aches or chills.  Denies any nausea or vomiting.  He was last around his positive pork about 1 month ago.  Patient denies any excessive caffeine use.  No recent travel or sick contacts other than this.  The history is provided by the patient.  Palpitations Associated symptoms: no back pain, no chest pain, no cough, no dizziness, no nausea, no shortness of breath and no vomiting     Past Medical History:  Diagnosis Date  . GERD (gastroesophageal reflux disease)   . PONV (postoperative nausea and vomiting)    nausea only    There are no active problems to display for this patient.   Past Surgical History:  Procedure Laterality Date  . OPEN REDUCTION INTERNAL FIXATION (ORIF) DISTAL RADIAL FRACTURE Left 06/25/2017   Procedure: OPEN REDUCTION INTERNAL FIXATION (ORIF) DISTAL RADIAL FRACTURE;  Surgeon: Bradly Bienenstockrtmann, Fred, MD;  Location: [facility] OR;  Service: Orthopedics;  Laterality: Left;  . WISDOM TOOTH EXTRACTION          Home Medications    Prior to Admission medications   Medication Sig Start Date End Date Taking? Authorizing Provider   ondansetron (ZOFRAN ODT) 4 MG disintegrating tablet Take 1 tablet (4 mg total) by mouth every 8 (eight) hours as needed for nausea or vomiting. 06/22/17   Long, Arlyss RepressJoshua G, MD  oxyCODONE-acetaminophen (PERCOCET/ROXICET) 5-325 MG tablet Take 1 tablet by mouth every 4 (four) hours as needed for severe pain. 06/22/17   Long, Arlyss RepressJoshua G, MD    Family History No family history on file.  Social History Social History   Tobacco Use  . Smoking status: Current Every Day Smoker    Types: Cigarettes  . Smokeless tobacco: Never Used  Substance Use Topics  . Alcohol use: Not Currently    Comment: occasional beer  . Drug use: No     Allergies   Patient has no known allergies.   Review of Systems Review of Systems  Constitutional: Negative for activity change, appetite change and fever.  HENT: Negative for congestion and rhinorrhea.   Eyes: Negative for visual disturbance.  Respiratory: Negative for cough, chest tightness and shortness of breath.   Cardiovascular: Positive for palpitations. Negative for chest pain.  Gastrointestinal: Negative for abdominal pain, nausea and vomiting.  Genitourinary: Negative for dysuria and hematuria.  Musculoskeletal: Negative for arthralgias, back pain and myalgias.  Skin: Negative for rash.  Neurological: Negative for dizziness, facial asymmetry and headaches.  Psychiatric/Behavioral: The patient is nervous/anxious.     all other systems are negative except as  noted in the HPI and PMH.    Physical Exam Updated Vital Signs BP (!) 142/100   Pulse 70   Temp (!) 97.5 F (36.4 C) (Oral)   Resp 16   SpO2 100%   Physical Exam Vitals signs and nursing note reviewed.  Constitutional:      General: He is not in acute distress.    Appearance: Normal appearance. He is well-developed and normal weight.  HENT:     Head: Normocephalic and atraumatic.     Mouth/Throat:     Pharynx: No oropharyngeal exudate.  Eyes:     Conjunctiva/sclera: Conjunctivae  normal.     Pupils: Pupils are equal, round, and reactive to light.  Neck:     Musculoskeletal: Normal range of motion and neck supple.     Comments: No meningismus. Cardiovascular:     Rate and Rhythm: Normal rate and regular rhythm.     Heart sounds: Normal heart sounds. No murmur.  Pulmonary:     Effort: Pulmonary effort is normal. No respiratory distress.     Breath sounds: Normal breath sounds.  Chest:     Chest wall: No tenderness.  Abdominal:     Palpations: Abdomen is soft.     Tenderness: There is no abdominal tenderness. There is no guarding or rebound.  Musculoskeletal: Normal range of motion.        General: No tenderness.  Skin:    General: Skin is warm.     Capillary Refill: Capillary refill takes less than 2 seconds.  Neurological:     General: No focal deficit present.     Mental Status: He is alert and oriented to person, place, and time. Mental status is at baseline.     Cranial Nerves: No cranial nerve deficit.     Motor: No abnormal muscle tone.     Coordination: Coordination normal.     Comments: No ataxia on finger to nose bilaterally. No pronator drift. 5/5 strength throughout. CN 2-12 intact.Equal grip strength. Sensation intact.   Psychiatric:        Behavior: Behavior normal.      ED Treatments / Results  Labs (all labs ordered are listed, but only abnormal results are displayed) Labs Reviewed  BASIC METABOLIC PANEL - Abnormal; Notable for the following components:      Result Value   CO2 21 (*)    Glucose, Bld 101 (*)    All other components within normal limits  CBC - Abnormal; Notable for the following components:   MCV 72.3 (*)    MCH 23.2 (*)    Platelets 132 (*)    All other components within normal limits  D-DIMER, QUANTITATIVE (NOT AT [facility])  TROPONIN I (HIGH SENSITIVITY)  TROPONIN I (HIGH SENSITIVITY)    EKG EKG Interpretation  Date/Time:  Thursday December 14 2018 23:59:09 EDT Ventricular Rate:  79 PR Interval:  132 QRS  Duration: 90 QT Interval:  358 QTC Calculation: 410 R Axis:   85 Text Interpretation:  Normal sinus rhythm Early repolarization Normal ECG No previous ECGs available Confirmed by Glynn Octaveancour, Jimel Myler [phone]([id number]) on 12/15/2018 4:56:16 AM   Radiology Dg Chest 2 View  Result Date: 12/15/2018 CLINICAL DATA:  Chest pain. EXAM: CHEST - 2 VIEW COMPARISON:  None. FINDINGS: The cardiomediastinal contours are normal. The lungs are clear. Pulmonary vasculature is normal. No consolidation, pleural effusion, or pneumothorax. No acute osseous abnormalities are seen. IMPRESSION: Negative radiographs of the chest. Electronically Signed   By: Ivette LoyalMelanie  Sanford M.D.  On: 12/15/2018 00:48    Procedures Procedures (including critical care time)  Medications Ordered in ED Medications - No data to display   Initial Impression / Assessment and Plan / ED Course  I have reviewed the triage vital signs and the nursing notes.  Pertinent labs & imaging results that were available during my care of the patient were reviewed by me and considered in my medical decision making (see chart for details).       Intermittent palpitations for the past several weeks.  Now resolved.  EKG is sinus rhythm with early repolarization.  Labs are reassuring with negative chest x-ray.  Patient has no symptoms himself to suggest coronavirus.  Work-up is reassuring with normal labs and chest x-ray.  Low suspicion for ACS, PE, or dissection.  Follow-up with his primary care physician for possible Holter monitor  and echocardiogram.  Return precautions discussed.  Final Clinical Impressions(s) / ED Diagnoses   Final diagnoses:  Palpitations    ED Discharge Orders    None       Quintasha Gren, Annie Main, MD 12/15/18 [phone]

## 2018-12-15 NOTE — ED Notes (Signed)
Patient verbalizes understanding of discharge instructions. Opportunity for questioning and answers were provided. Armband removed by staff, pt discharged from ED ambulatory.   

## 2018-12-15 NOTE — ED Triage Notes (Addendum)
Pt c/o a palpitations that lasted a couple of minutes immediately after waking night. 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

## 2018-12-28 ENCOUNTER — Telehealth: Payer: Self-pay | Admitting: Internal Medicine

## 2018-12-28 NOTE — Telephone Encounter (Signed)
LVM, asking pt if he would like an Office Visit instead of a Virtual Visit with Dr Margaretann Loveless on 12-29-18.

## 2018-12-29 ENCOUNTER — Ambulatory Visit: Payer: BC Managed Care – PPO | Admitting: Internal Medicine

## 2018-12-29 ENCOUNTER — Encounter: Payer: Self-pay | Admitting: Internal Medicine

## 2018-12-29 ENCOUNTER — Other Ambulatory Visit: Payer: Self-pay

## 2018-12-29 VITALS — BP 128/66 | HR 73 | Temp 98.7°F | Ht 71.0 in | Wt 172.0 lb

## 2018-12-29 DIAGNOSIS — R002 Palpitations: Secondary | ICD-10-CM | POA: Diagnosis not present

## 2018-12-29 NOTE — Patient Instructions (Addendum)
Medication Instructions:  N/A  If you need a refill on your cardiac medications before your next appointment, please call your pharmacy.    Testing/Procedures:  Someone will call you to schedule these:  Your physician has requested that you have an echocardiogram. Echocardiography is a painless test that uses sound waves to create images of your heart. It provides your doctor with information about the size and shape of your heart and how well your heart's chambers and valves are working. This procedure takes approximately one hour. There are no restrictions for this procedure.  Your physician has recommended that you wear a ZIO Patch 14 day monitor. Event monitors are medical devices that record the heart's electrical activity. Doctors most often [place] these monitors to diagnose arrhythmias. Arrhythmias are problems with the speed or rhythm of the heartbeat. The monitor is a small, portable device. You can wear one while you do your normal daily activities. This is usually used to diagnose what is causing palpitations/syncope (passing out).  Follow-Up: At [facility], you and your health needs are our priority.  As part of our continuing mission to provide you with exceptional heart care, we have created designated Provider Care Teams.  These Care Teams include your primary Cardiologist (physician) and Advanced Practice Providers (APPs -  Physician Assistants and Nurse Practitioners) who all work together to provide you with the care you need, when you need it. . You have been scheduled for a follow-up appointment with Jory Sims, NP on Wednesday, 01/31/19 at 3:15 PM.

## 2018-12-29 NOTE — Progress Notes (Signed)
Cardiology Office Note:    Date:  12/29/2018   ID:  Carlos Damaim Zuluaga, DOB November 25, 1985, MRN [id number]  PCP:  Patient, No Pcp Per  Cardiologist:  No primary care provider on file.  Electrophysiologist:  None   Referring MD: No ref. provider found   Chief Complaint: palpitations  History of Present Illness:    Carlos Pittman is a 33 y.o. male with a hx of GERD and no other significant past medical history who presents today for evaluation of a few weeks of palpitations.  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  He is able to take a deep breath and the symptoms improve.  They do not cause presyncope or syncope.  He attributes some of his symptoms to anxiety over COVID.  He has 3 children ages 614, 716, and 33-year-old.  He is concerned about bringing coronavirus home to his family and feels that this causes anxiety.  Prior to his initiation of palpitations, he denies any recent illnesses.  8-pack-year smoking history, attempting to quit and is down to 3 to 4 cigarettes a day now.  The palpitations will wake him from sleep at times.  It feels like irregular heartbeats.   No fhx of palpitations, sudden cardiac death, or early MI.  Caffeine: used ot drink energy drinks. Coffee 1-2 cups a day Alcohol: no Water intake: poor hydration. Snoring: snore, not been screened for sleep apnea.  TSH: not checked Herbal supplements/diet products: no  Syncope/presyncope: no lifetime.  The patient denies chest pain, chest pressure, dyspnea at rest or with exertion, PND, orthopnea, or leg swelling. Denies syncope or presyncope. Denies dizziness or lightheadedness. Denies snoring and has not been evaluated for sleep apnea.  Past Medical History:  Diagnosis Date  . GERD (gastroesophageal reflux disease)   . PONV (postoperative nausea and vomiting)    nausea  only    Past Surgical History:  Procedure Laterality Date  . OPEN REDUCTION INTERNAL FIXATION (ORIF) DISTAL RADIAL FRACTURE Left 06/25/2017   Procedure: OPEN REDUCTION INTERNAL FIXATION (ORIF) DISTAL RADIAL FRACTURE;  Surgeon: Bradly Bienenstockrtmann, Fred, MD;  Location: [facility] OR;  Service: Orthopedics;  Laterality: Left;  . WISDOM TOOTH EXTRACTION      Current Medications: No outpatient medications have been marked as taking for the 12/29/18 encounter (Office Visit) with Parke PoissonAcharya, Maybelline Kolarik A, MD.     Allergies:   Patient has no known allergies.   Social History   Socioeconomic History  . Marital status: Married    Spouse name: Not on file  . Number of children: Not on file  . Years of education: Not on file  . Highest education level: Not on file  Occupational History  . Not on file  Social Needs  . Financial resource strain: Not on file  . Food insecurity    Worry: Not on file    Inability: Not on file  . Transportation needs    Medical: Not on file    Non-medical: Not on file  Tobacco Use  . Smoking status: Current Every Day Smoker    Types: Cigarettes  . Smokeless tobacco: Never Used  Substance and Sexual Activity  . Alcohol use: Not Currently    Comment: occasional beer  . Drug use: No  . Sexual activity: Not on file  Lifestyle  . Physical activity    Days per  week: Not on file    Minutes per session: Not on file  . Stress: Not on file  Relationships  . Social Herbalist on phone: Not on file    Gets together: Not on file    Attends religious service: Not on file    Active member of club or organization: Not on file    Attends meetings of clubs or organizations: Not on file    Relationship status: Not on file  Other Topics Concern  . Not on file  Social History Narrative  . Not on file     Family History: The patient's is negative for SCD or early MI.   ROS:   Please see the history of present illness.    All other systems reviewed and are negative.   EKGs/Labs/Other Studies Reviewed:    The following studies were reviewed today:  EKG: Normal sinus rhythm, early repolarization.  Recent Labs: 12/15/2018: BUN 14; Creatinine, Ser 0.92; Hemoglobin 13.4; Platelets 132; Potassium 3.7; Sodium 138  Recent Lipid Panel No results found for: CHOL, TRIG, HDL, CHOLHDL, VLDL, LDLCALC, LDLDIRECT  Physical Exam:    VS:  BP 128/66   Pulse 73   Temp 98.7 F (37.1 C) (Temporal)   Ht 5\' 11"  (1.803 m)   Wt 172 lb (78 kg)   BMI 23.99 kg/m     Wt Readings from Last 5 Encounters:  12/29/18 172 lb (78 kg)  06/25/17 180 lb (81.6 kg)  06/22/17 180 lb (81.6 kg)     Constitutional: No acute distress Eyes: sclera non-icteric, normal conjunctiva and lids ENMT: normal dentition, moist mucous membranes Cardiovascular: regular rhythm, normal rate, no murmurs. S1 and S2 normal. Radial pulses normal bilaterally. No jugular venous distention.  Respiratory: clear to auscultation bilaterally GI : normal bowel sounds, soft and nontender. No distention.   MSK: extremities warm, well perfused. No edema.  NEURO: grossly nonfocal exam, moves all extremities. PSYCH: alert and oriented x 3, normal mood and affect.   ASSESSMENT:    1. Palpitations    PLAN:    Palpitations- he will be important for him to limit his caffeine intake, focus on hydration.  We will obtain an echocardiogram to ensure he does not have congenital heart disease contributing or structural abnormalities contributing to palpitations.  We will also obtain a ZIO patch for 14 days since his symptoms occur 1-2 times per week.  I will have him follow-up with 1 of my colleagues in 1 month for review of test results.   TIME SPENT WITH PATIENT: 45 minutes of direct patient care. More than 50% of that time was spent on coordination of care and counseling regarding palpitations, lifestyle modification, and risk factor management.Cherlynn Kaiser, MD [place]  [facility]   Medication  Adjustments/Labs and Tests Ordered: Current medicines are reviewed at length with the patient today.  Concerns regarding medicines are outlined above.  Orders Placed This Encounter  Procedures  . LONG TERM MONITOR (3-14 DAYS)  . EKG 12-Lead  . ECHOCARDIOGRAM COMPLETE   No orders of the defined types were placed in this encounter.   Patient Instructions  Medication Instructions:  N/A  If you need a refill on your cardiac medications before your next appointment, please call your pharmacy.    Testing/Procedures:  Someone will call you to schedule these:  Your physician has requested that you have an echocardiogram. Echocardiography is a painless test that uses sound waves to create images of your heart.  It provides your doctor with information about the size and shape of your heart and how well your heart's chambers and valves are working. This procedure takes approximately one hour. There are no restrictions for this procedure.  Your physician has recommended that you wear a ZIO Patch 14 day monitor. Event monitors are medical devices that record the heart's electrical activity. Doctors most often us these monitors to diagnose arrhythmias. Arrhythmias are problems with the speed or rhythm of the heartbeat. The monitor is a small, portable device. You can wear one while you do your normal daily activities. This is usually used to diagnose what is causing palpitations/syncope (passing out).  Follow-Up: At [facility], you and your health needs are our priority.  As part of our continuing mission to provide you with exceptional heart care, we have created designated Provider Care Teams.  These Care Teams include your primary Cardiologist (physician) and Advanced Practice Providers (APPs -  Physician Assistants and Nurse Practitioners) who all work together to provide you with the care you need, when you need it. . You have been scheduled for a follow-up appointment with Joni ReiningKathryn Lawrence,  NP on Wednesday, 01/31/19 at 3:15 PM.

## 2019-01-01 ENCOUNTER — Telehealth: Payer: Self-pay | Admitting: *Deleted

## 2019-01-01 NOTE — Telephone Encounter (Signed)
14 day ZIO XT long term holter monitor to be mailed to patients home.  Instructions reviewed briefly as they will be included in the monitor kit.

## 2019-01-02 ENCOUNTER — Ambulatory Visit (HOSPITAL_COMMUNITY): Payer: BC Managed Care – PPO | Attending: Internal Medicine

## 2019-01-02 ENCOUNTER — Other Ambulatory Visit: Payer: Self-pay

## 2019-01-02 DIAGNOSIS — R002 Palpitations: Secondary | ICD-10-CM | POA: Insufficient documentation

## 2019-01-05 ENCOUNTER — Telehealth: Payer: Self-pay | Admitting: Adult Health

## 2019-01-05 ENCOUNTER — Encounter: Payer: Self-pay | Admitting: Internal Medicine

## 2019-01-05 NOTE — Telephone Encounter (Signed)
New Message    Pt is calling for results from his Echo   Please call

## 2019-01-05 NOTE — Telephone Encounter (Signed)
Spoke to patient echo results given. 

## 2019-01-07 ENCOUNTER — Ambulatory Visit (INDEPENDENT_AMBULATORY_CARE_PROVIDER_SITE_OTHER): Payer: BC Managed Care – PPO

## 2019-01-07 DIAGNOSIS — R002 Palpitations: Secondary | ICD-10-CM

## 2019-01-29 ENCOUNTER — Ambulatory Visit: Payer: BC Managed Care – PPO | Admitting: Adult Health

## 2019-01-31 ENCOUNTER — Encounter: Payer: Self-pay | Admitting: Adult Health

## 2019-01-31 ENCOUNTER — Other Ambulatory Visit: Payer: Self-pay

## 2019-01-31 ENCOUNTER — Ambulatory Visit: Payer: BC Managed Care – PPO | Admitting: Adult Health

## 2019-01-31 VITALS — BP 125/85 | HR 88 | Ht 71.0 in | Wt 178.6 lb

## 2019-01-31 DIAGNOSIS — R002 Palpitations: Secondary | ICD-10-CM | POA: Diagnosis not present

## 2019-01-31 NOTE — Progress Notes (Signed)
Cardiology Office Note   Date:  01/31/2019   ID:  Carlos Damaim Balboa, DOB July 28, 1985, MRN [id number]  PCP:  Patient, No Pcp Per  Cardiologist:  Dr. Jacques NavyAcharya  No chief complaint on file.    History of Present Illness: Carlos Pittman is a 33 y.o. male who presents for ongoing assessment and management of palpitations.  He was last seen in the office on 12/29/2018.  He was having a lot of anxiety as he works in [employer] and was afraid of bringing COVID home to his children by being out in public working.  He apparently has 3 children ages 2114, 536, and a 33-year-old.  He continues to smoke but had been working on quitting.  He denied chest pain, or dyspnea, presyncope or dizziness.  He was recommended to limit his caffeine intake, increase hydration.  Echocardiogram was ordered to evaluate for congenital heart disease contributing to his symptoms or for structural abnormalities.  A ZIO patch was ordered for 14 days to evaluate frequency and morphology of palpitations.  Echocardiogram was ordered revealing an EF of 65%, no LVH, no valvular abnormalities or structural heart disease.  There were no significant ventricular abnormalities on cardiac monitor.  He comes today feeling some better, but continues to have occasional palpitations.  He thinks it is more related to anxiety.  He continues to be very vigilant and aware of social distancing and handwashing in the setting of COVID and has been very focused on keeping his family safe.  He believes his anxiety related to all of this is playing a part in his symptoms.  Past Medical History:  Diagnosis Date  . GERD (gastroesophageal reflux disease)   . PONV (postoperative nausea and vomiting)    nausea only    Past Surgical History:  Procedure Laterality Date  . OPEN REDUCTION INTERNAL FIXATION (ORIF) DISTAL RADIAL FRACTURE Left 06/25/2017   Procedure: OPEN REDUCTION INTERNAL FIXATION (ORIF) DISTAL RADIAL FRACTURE;  Surgeon: Bradly Bienenstockrtmann, Fred, MD;   Location: [facility] OR;  Service: Orthopedics;  Laterality: Left;  . WISDOM TOOTH EXTRACTION       No current outpatient medications on file.   No current facility-administered medications for this visit.     Allergies:   Patient has no known allergies.    Social History:  The patient  reports that he has been smoking cigarettes. He has never used smokeless tobacco. He reports previous alcohol use. He reports that he does not use drugs.   Family History:  The patient's family history is not on file.    ROS: All other systems are reviewed and negative. Unless otherwise mentioned in H&P    PHYSICAL EXAM: VS:  BP 125/85   Pulse 88   Ht 5\' 11"  (1.803 m)   Wt 178 lb 9.6 oz (81 kg)   SpO2 99%   BMI 24.91 kg/m  , BMI Body mass index is 24.91 kg/m. GEN: Well nourished, well developed, in no acute distress HEENT: normal Neck: no JVD, carotid bruits, or masses Cardiac: RRR; no murmurs, rubs, or gallops,no edema  Respiratory:  Clear to auscultation bilaterally, normal work of breathing GI: soft, nontender, nondistended, + BS MS: no deformity or atrophy Skin: warm and dry, no rash Neuro:  Strength and sensation are intact Psych: euthymic mood, full affect   EKG: Not completed during this office visit  Recent Labs: 12/15/2018: BUN 14; Creatinine, Ser 0.92; Hemoglobin 13.4; Platelets 132; Potassium 3.7; Sodium 138    Lipid Panel No results found for: CHOL, TRIG,  HDL, CHOLHDL, VLDL, LDLCALC, LDLDIRECT    Wt Readings from Last 3 Encounters:  01/31/19 178 lb 9.6 oz (81 kg)  12/29/18 172 lb (78 kg)  06/25/17 180 lb (81.6 kg)      Other studies Reviewed: Cardiac Monitor 01/31/2019  Patient had a min HR of 44 bpm, max HR of 148 bpm, and avg HR of 77 bpm. Predominant underlying rhythm was Sinus Rhythm. Isolated SVEs were rare (<1.0%), SVE Couplets were rare (<1.0%), and SVE Triplets were rare (<1.0%). Isolated VEs were rare (<1.0%), VE Couplets were rare (<1.0%), and no VE Triplets  were present.   ASSESSMENT AND PLAN  1.  Frequent palpitations: Review of cardiac monitor results revealed that he did have one episode of rapid heart rhythm of 240 bpm which occurred around 5 PM on the first day that he wore it.  Average heart rate was 77 bpm.  The elevated heart rate only lasted a few seconds.  He does not remember what he was doing at the time.  He was asymptomatic and did not trigger the monitor.  I reviewed the results of his monitor, along with the results of his echocardiogram.  Reassurance is given.  He is advised that if he has worsening of palpitations occurring several times during the day we may need to provide him with a short acting beta-blocker such as propanolol to use as needed.  For now I do not feel it is necessary to do so unless he becomes more symptomatic or there is a increase in frequency of his palpitations.  2.  GERD: The patient states that he has been noticing more heartburn and is asking if taking pantoprazole is okay.  I have advised that this is okay for him to take.  He can also take over-the-counter famotidine for quicker relief as the PPI may take a few days to get into his system.  He verbalizes understanding.     Current medicines are reviewed at length with the patient today.    Labs/ tests ordered today include: None Phill Myron. West Pugh, ANP, AACC   01/31/2019 4:52 PM    [place] [place] [address] Office [phone] Fax [phone]

## 2019-01-31 NOTE — Patient Instructions (Signed)
Medication Instructions:  Continue current medications  If you need a refill on your cardiac medications before your next appointment, please call your pharmacy.  Labwork: None Ordered   Testing/Procedures: None ordered  Special Instructions: You can take and OTC omeprazole  Follow-Up: You will need a follow up appointment in 6 months.  Please call our office 2 months in advance to schedule this appointment.  You may see Dr Margaretann Loveless or one of the following Advanced Practice Providers on your designated Care Team:   Rosaria Ferries, PA-C . Jory Sims, DNP, ANP     At [facility], you and your health needs are our priority.  As part of our continuing mission to provide you with exceptional heart care, we have created designated Provider Care Teams.  These Care Teams include your primary Cardiologist (physician) and Advanced Practice Providers (APPs -  Physician Assistants and Nurse Practitioners) who all work together to provide you with the care you need, when you need it.  Thank you for choosing [facility] at [facility]!!

## 2019-04-09 IMAGING — DX DG WRIST COMPLETE 3+V*L*
4 series · 4 of 4 positions shown · non-contrast
Comparison: None.

CLINICAL DATA: Fall during basketball game with pain and deformity.

EXAM:
LEFT WRIST - COMPLETE 3+ VIEW

[wrist pa]
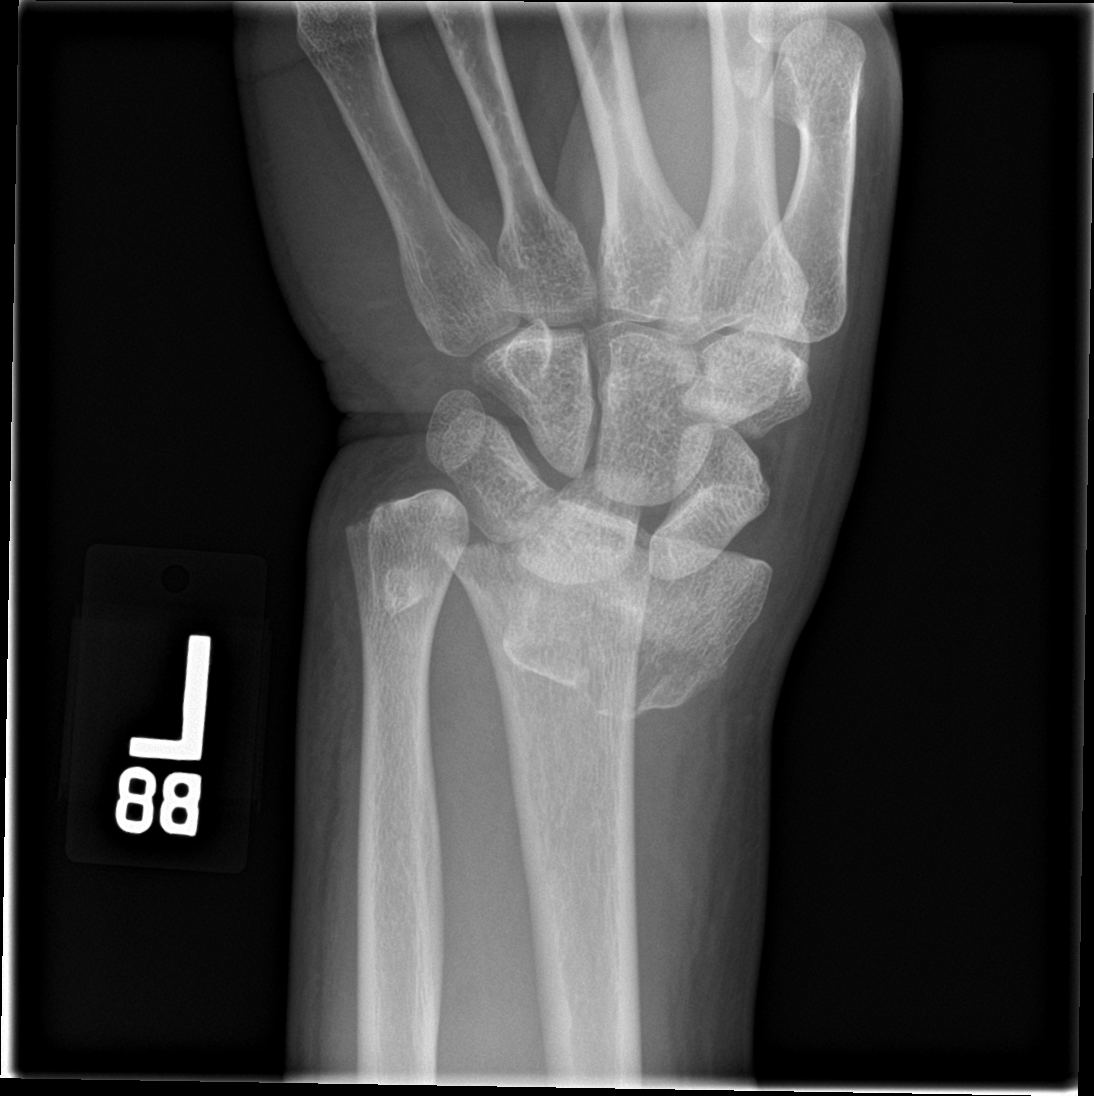

[wrist obl]
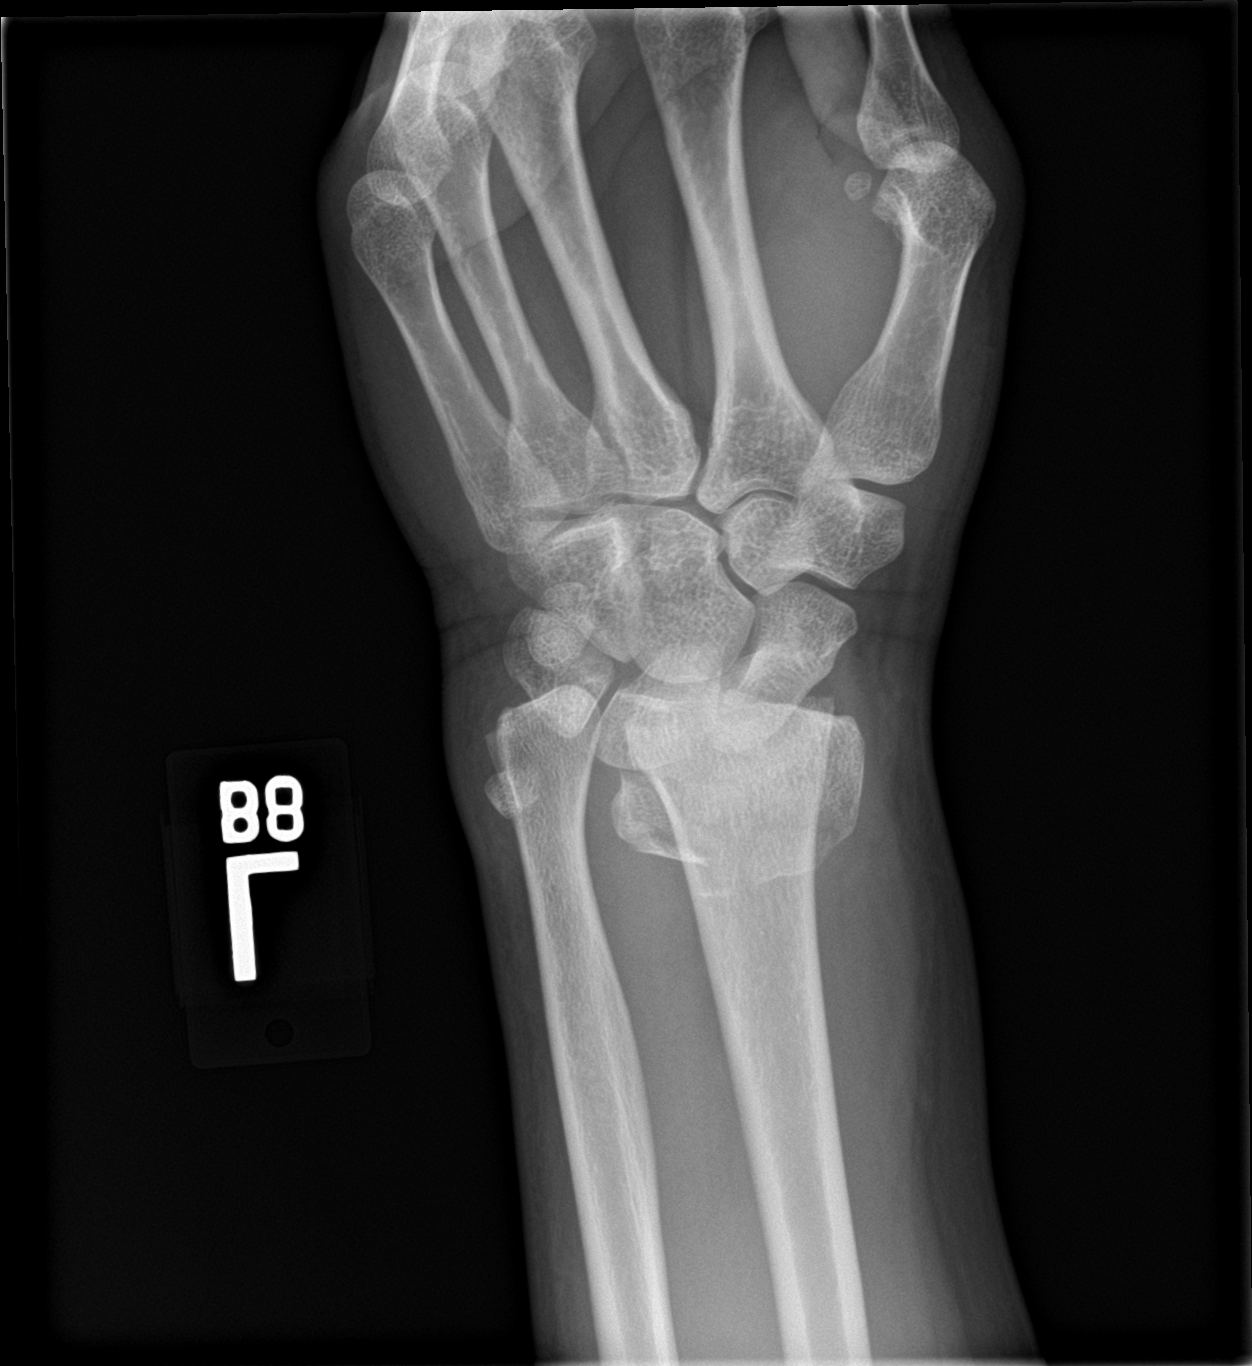

[wrist lat]
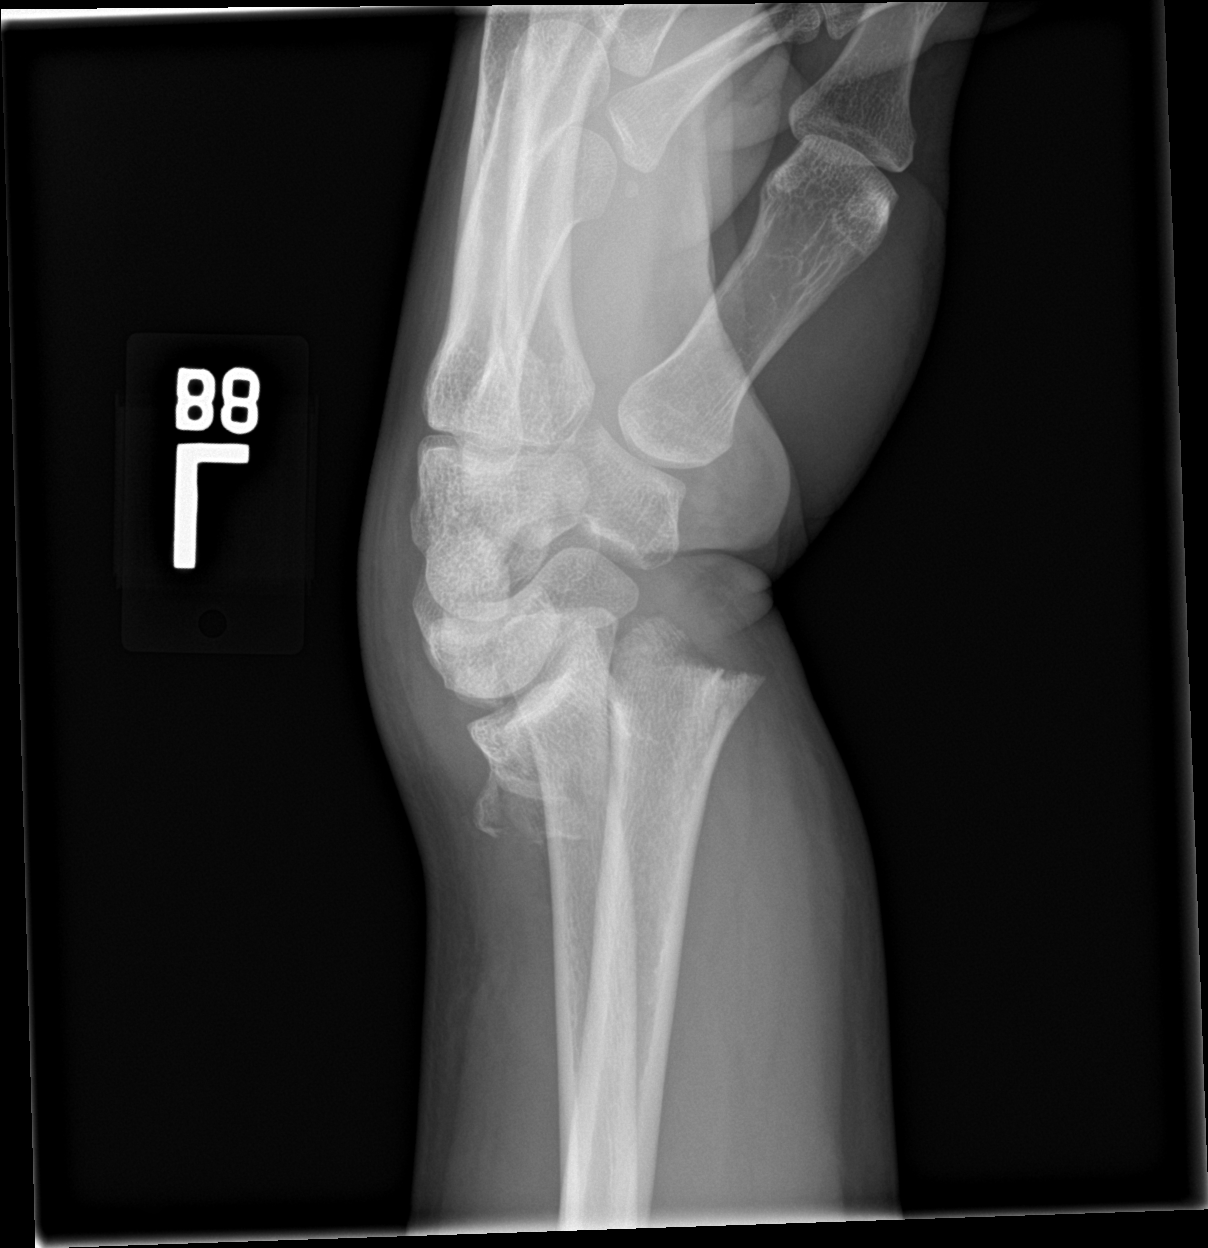

[wrist navicular]
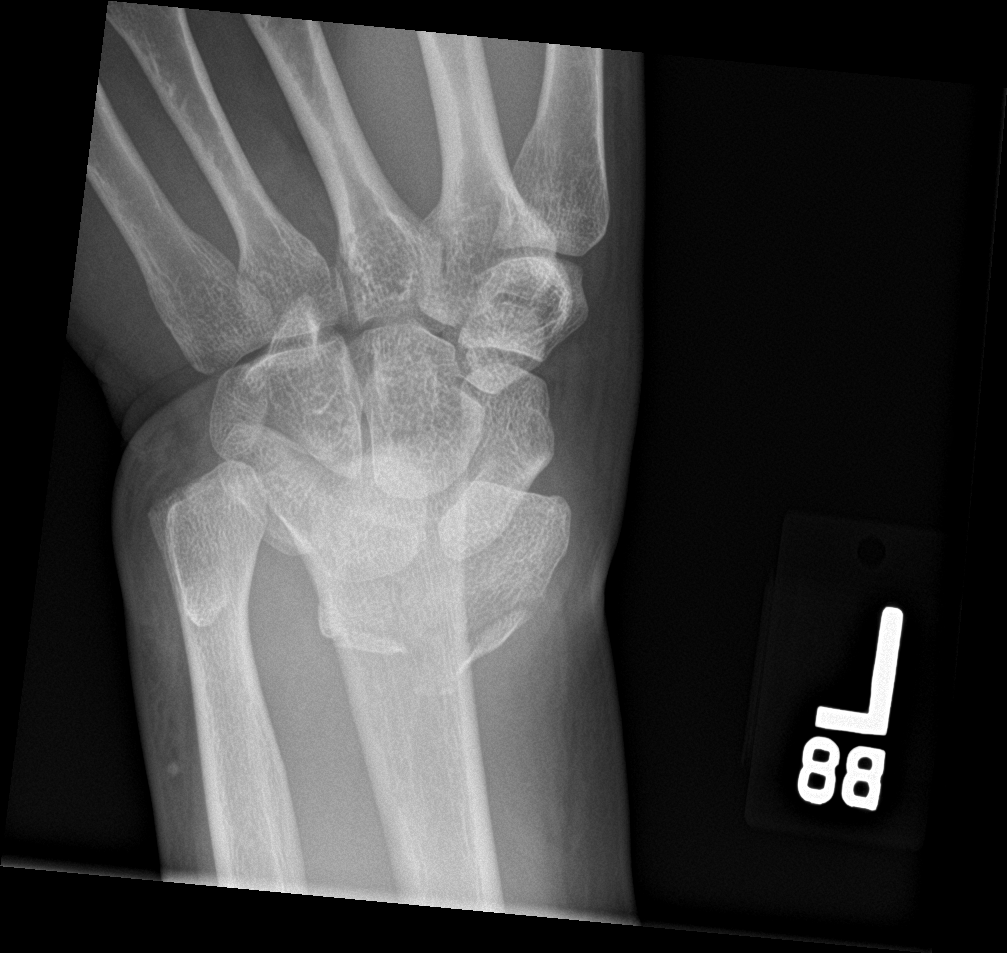

[4 of 4 positions shown; findings below may reference images not displayed]

FINDINGS: Transverse fracture of the distal radial metaphysis with complete
dorsal displacement of the distal fragment and foreshortening.
Transverse fracture of the ulnar styloid, also dorsally displaced.
Carpal bones move with the distal radial articular surface.
IMPRESSION: Distal radial metaphyseal fracture and ulnar styloid fracture
completely displaced dorsally with some foreshortening. Carpal bones
move with the distal radial articular surface.

## 2019-08-13 ENCOUNTER — Encounter: Payer: Self-pay | Admitting: Internal Medicine

## 2020-10-01 IMAGING — DX CHEST - 2 VIEW
2 series · 2 of 2 positions shown · non-contrast
Comparison: None.

CLINICAL DATA: Chest pain.

EXAM:
CHEST - 2 VIEW

[chest pa]
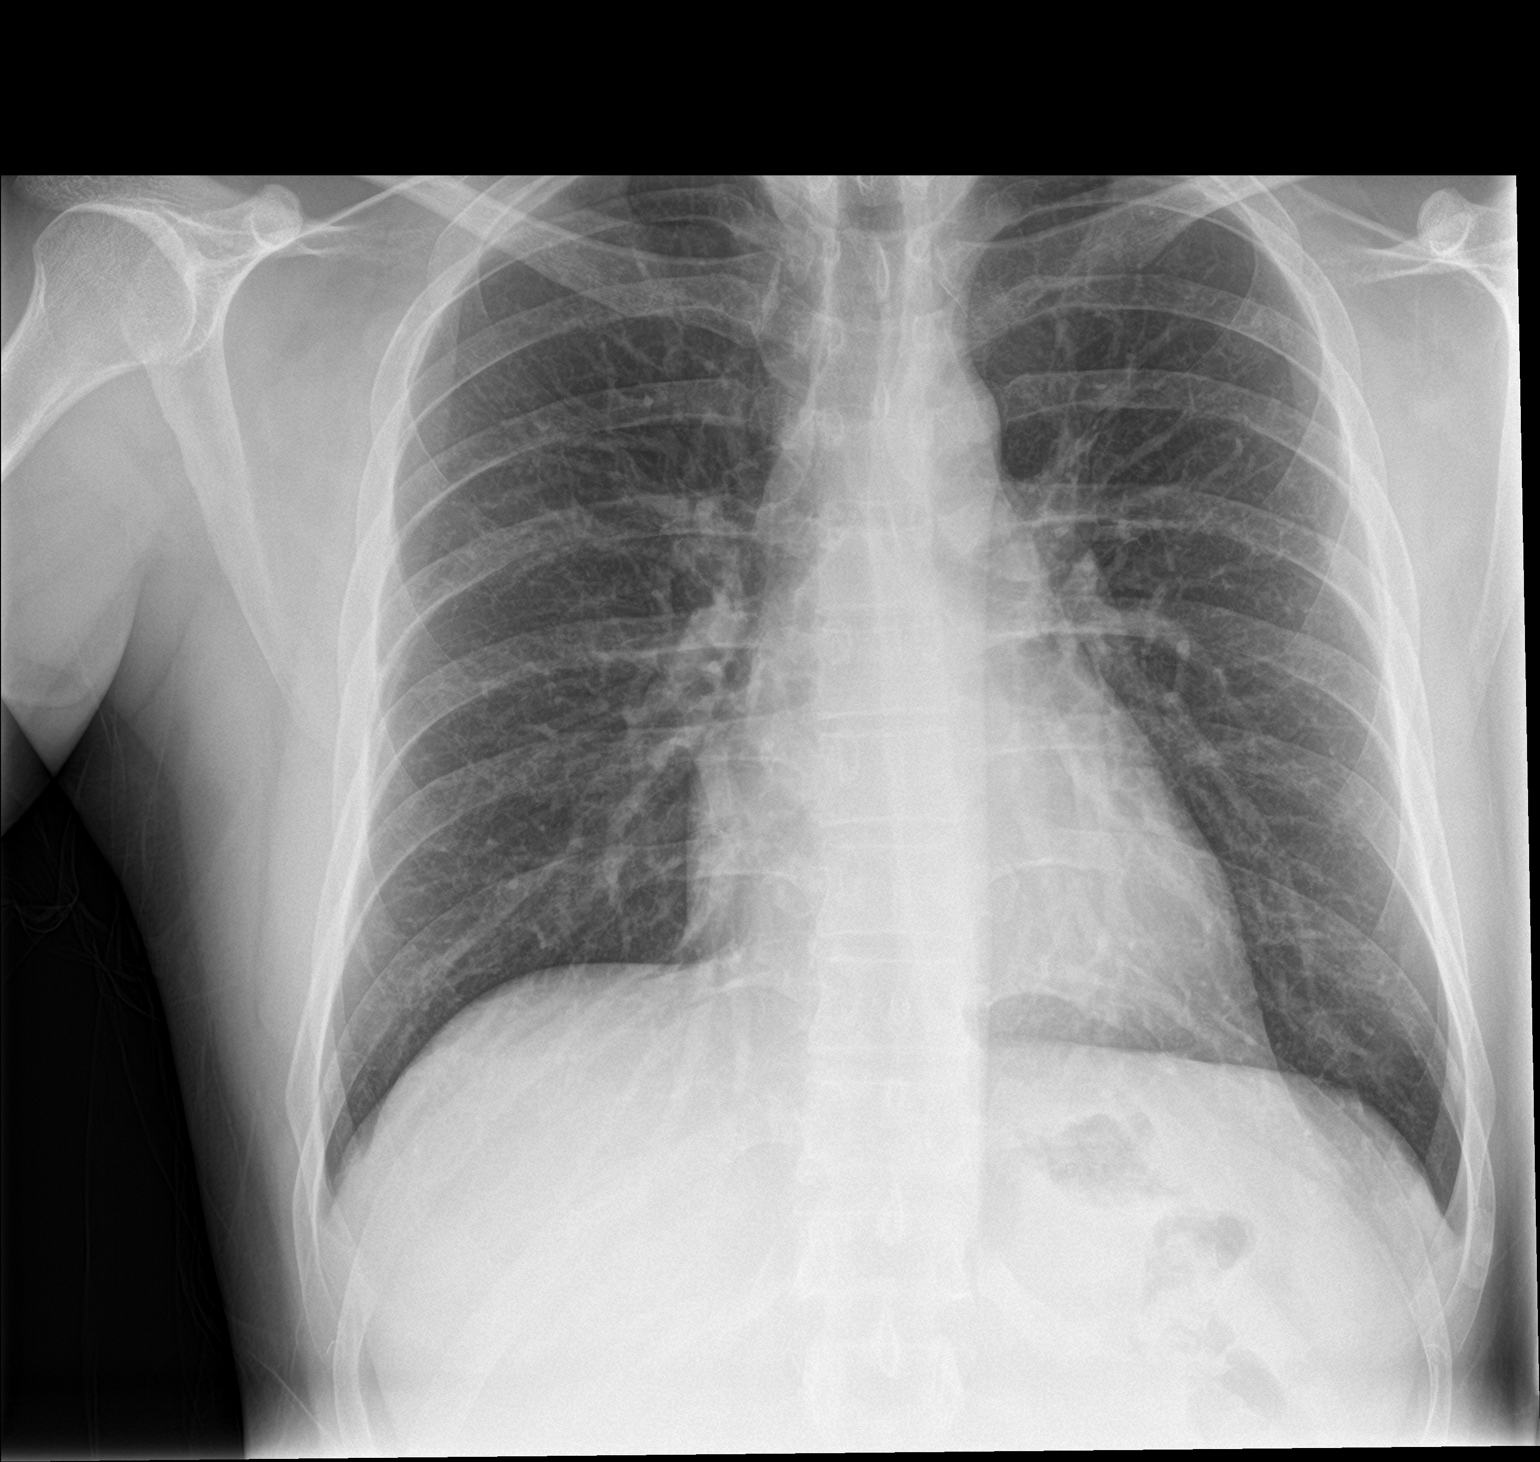

[chest lat]
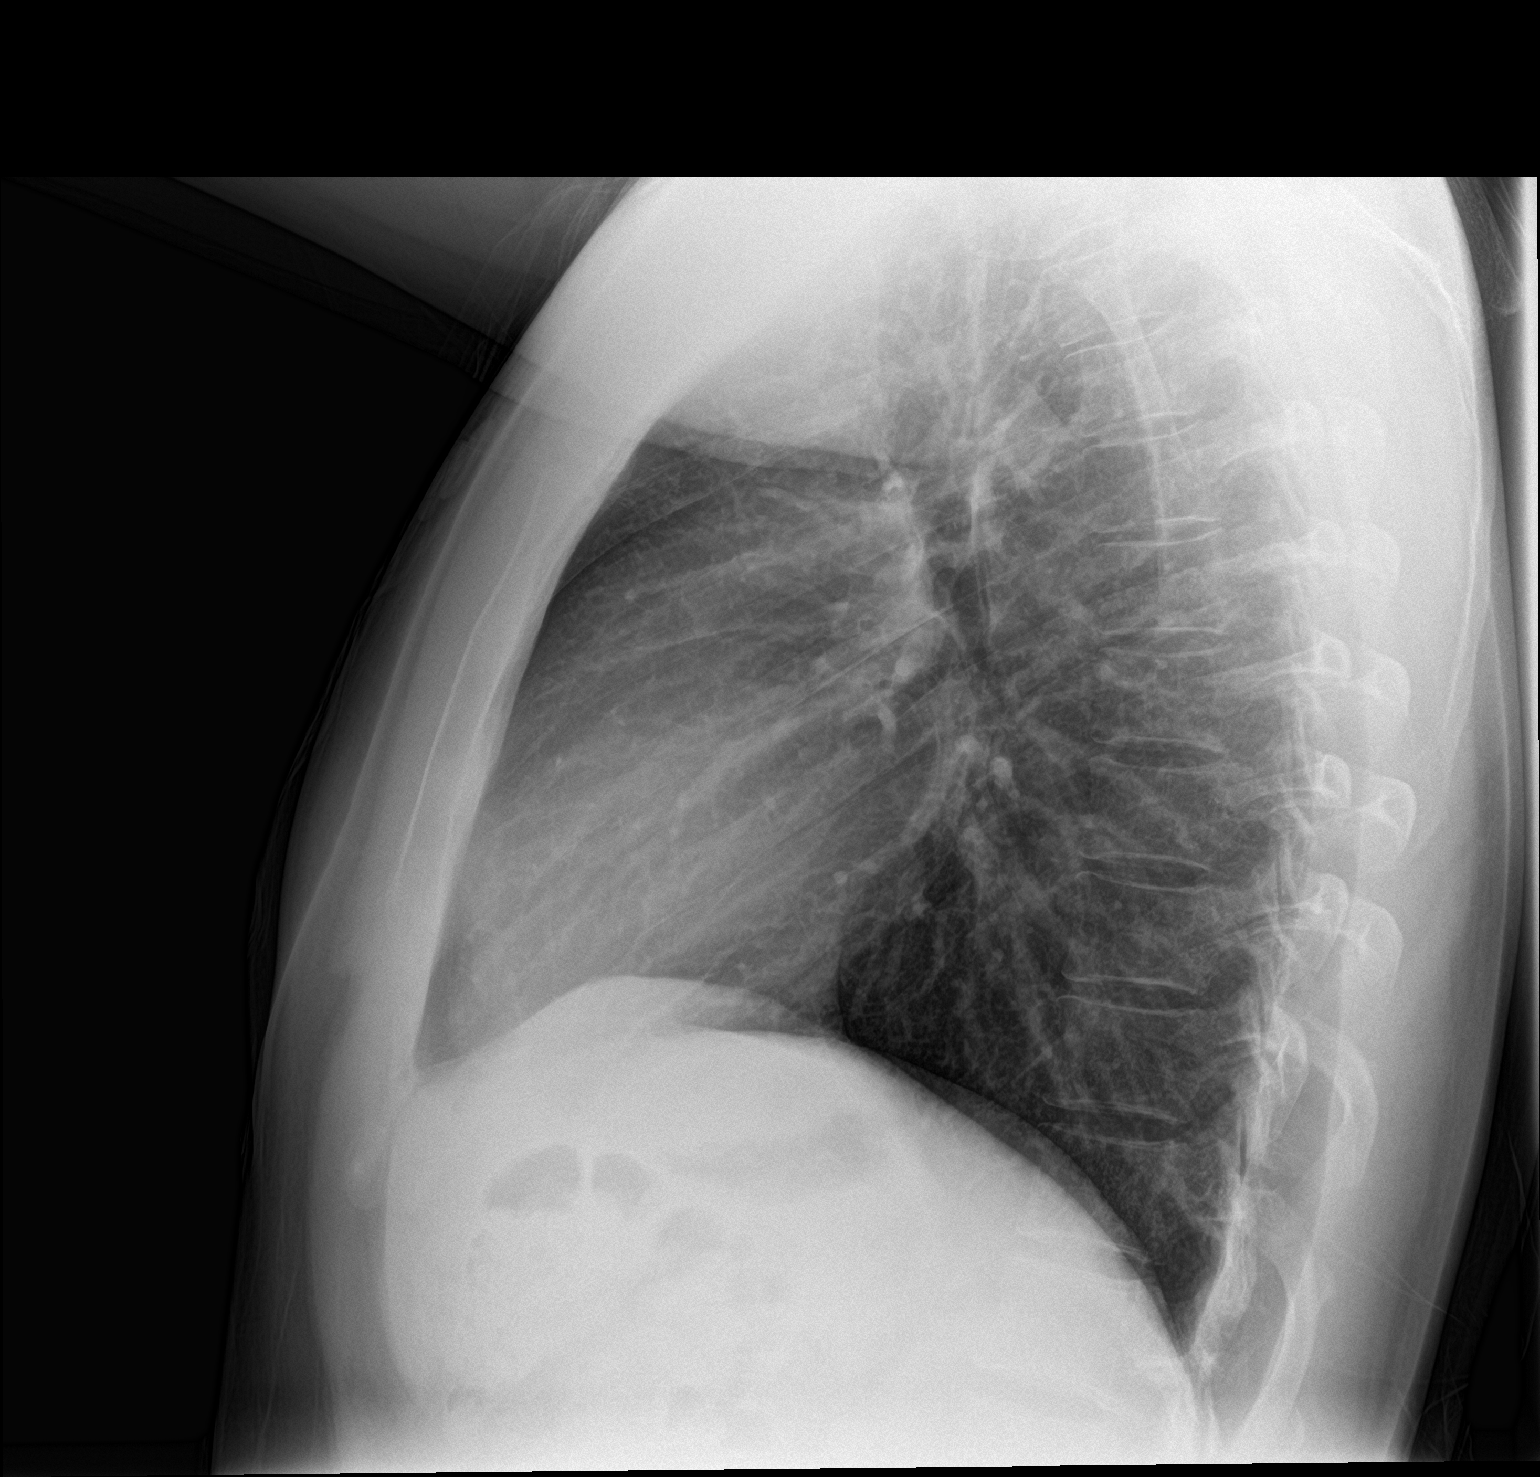

[2 of 2 positions shown; findings below may reference images not displayed]

FINDINGS: The cardiomediastinal contours are normal. The lungs are clear.
Pulmonary vasculature is normal. No consolidation, pleural effusion,
or pneumothorax. No acute osseous abnormalities are seen.
IMPRESSION: Negative radiographs of the chest.
